# Patient Record
Sex: Female | Born: 1950 | Race: Black or African American | Hispanic: No | Smoking: Never smoker
Health system: Southern US, Community
[De-identification: ages and names within clinical notes are randomized; demographics above are authoritative.]

## PROBLEM LIST (undated history)

## (undated) DIAGNOSIS — R339 Retention of urine, unspecified: Secondary | ICD-10-CM

## (undated) DIAGNOSIS — R519 Headache, unspecified: Secondary | ICD-10-CM

## (undated) DIAGNOSIS — R3129 Other microscopic hematuria: Secondary | ICD-10-CM

## (undated) DIAGNOSIS — F329 Major depressive disorder, single episode, unspecified: Secondary | ICD-10-CM

## (undated) DIAGNOSIS — F32A Depression, unspecified: Secondary | ICD-10-CM

## (undated) DIAGNOSIS — K449 Diaphragmatic hernia without obstruction or gangrene: Secondary | ICD-10-CM

## (undated) DIAGNOSIS — A6000 Herpesviral infection of urogenital system, unspecified: Secondary | ICD-10-CM

## (undated) DIAGNOSIS — R42 Dizziness and giddiness: Secondary | ICD-10-CM

## (undated) DIAGNOSIS — N362 Urethral caruncle: Secondary | ICD-10-CM

## (undated) DIAGNOSIS — N952 Postmenopausal atrophic vaginitis: Secondary | ICD-10-CM

## (undated) DIAGNOSIS — M26629 Arthralgia of temporomandibular joint, unspecified side: Secondary | ICD-10-CM

## (undated) DIAGNOSIS — R51 Headache: Secondary | ICD-10-CM

## (undated) DIAGNOSIS — H8109 Meniere's disease, unspecified ear: Secondary | ICD-10-CM

## (undated) DIAGNOSIS — M549 Dorsalgia, unspecified: Secondary | ICD-10-CM

## (undated) HISTORY — DX: Headache: R51

## (undated) HISTORY — DX: Arthralgia of temporomandibular joint, unspecified side: M26.629

## (undated) HISTORY — DX: Herpesviral infection of urogenital system, unspecified: A60.00

## (undated) HISTORY — DX: Meniere's disease, unspecified ear: H81.09

## (undated) HISTORY — DX: Dorsalgia, unspecified: M54.9

## (undated) HISTORY — DX: Urethral caruncle: N36.2

## (undated) HISTORY — DX: Other microscopic hematuria: R31.29

## (undated) HISTORY — DX: Diaphragmatic hernia without obstruction or gangrene: K44.9

## (undated) HISTORY — DX: Retention of urine, unspecified: R33.9

## (undated) HISTORY — DX: Postmenopausal atrophic vaginitis: N95.2

## (undated) HISTORY — DX: Headache, unspecified: R51.9

---

## 1981-07-18 HISTORY — PX: TUBAL LIGATION: SHX77

## 1998-07-18 HISTORY — PX: OTHER SURGICAL HISTORY: SHX169

## 1998-07-18 HISTORY — PX: EYE SURGERY: SHX253

## 2000-07-18 HISTORY — PX: SHOULDER ARTHROSCOPY: SHX128

## 2004-04-26 ENCOUNTER — Ambulatory Visit: Payer: Self-pay | Admitting: Unknown Physician Specialty

## 2005-05-20 ENCOUNTER — Ambulatory Visit: Payer: Self-pay | Admitting: Family Medicine

## 2009-05-19 ENCOUNTER — Emergency Department: Payer: Self-pay | Admitting: Emergency Medicine

## 2010-07-01 ENCOUNTER — Ambulatory Visit: Payer: Self-pay | Admitting: Otolaryngology

## 2011-06-01 ENCOUNTER — Emergency Department (INDEPENDENT_AMBULATORY_CARE_PROVIDER_SITE_OTHER): Payer: Worker's Compensation

## 2011-06-01 ENCOUNTER — Emergency Department (INDEPENDENT_AMBULATORY_CARE_PROVIDER_SITE_OTHER)
Admission: EM | Admit: 2011-06-01 | Discharge: 2011-06-01 | Disposition: A | Discharge status: Home / Self Care | Payer: Worker's Compensation | Source: Home / Self Care

## 2011-06-01 ENCOUNTER — Encounter: Payer: Self-pay | Admitting: *Deleted

## 2011-06-01 DIAGNOSIS — M79609 Pain in unspecified limb: Secondary | ICD-10-CM

## 2011-06-01 DIAGNOSIS — S8010XA Contusion of unspecified lower leg, initial encounter: Secondary | ICD-10-CM

## 2011-06-01 DIAGNOSIS — S8012XA Contusion of left lower leg, initial encounter: Secondary | ICD-10-CM

## 2011-06-01 HISTORY — DX: Depression, unspecified: F32.A

## 2011-06-01 HISTORY — DX: Dizziness and giddiness: R42

## 2011-06-01 HISTORY — DX: Major depressive disorder, single episode, unspecified: F32.9

## 2011-06-01 MED ORDER — HYDROCODONE-ACETAMINOPHEN 5-500 MG PO TABS: 1.0000 | ORAL_TABLET | Freq: Four times a day (QID) | ORAL | Status: AC | PRN

## 2011-06-01 MED ORDER — IBUPROFEN 800 MG PO TABS: 800.0000 mg | ORAL_TABLET | Freq: Three times a day (TID) | ORAL | Status: AC

## 2011-06-01 NOTE — ED Notes (Signed)
Pt is here with complaints of left lower leg/knee pain following a fall down the stairs Friday night.  Bruising noted.

## 2011-06-08 NOTE — ED Provider Notes (Signed)
History     CSN: [id number] Arrival date & time: 06/01/2011  6:52 PM   First MD Initiated Contact with Patient 06/01/11 1858      Chief Complaint  Patient presents with  . Knee Injury    (Consider location/radiation/quality/duration/timing/severity/associated sxs/prior treatment) Patient is a 60 y.o. female presenting with leg pain. The history is provided by the patient.  Leg Pain  The incident occurred more than 2 days ago (fell down the stairs 4 days ago hit her left leg below the knee. persistent pain has a bruise.). The injury mechanism was a fall. The pain is present in the left leg and left knee. The quality of the pain is described as aching. The pain is at a severity of 4/10. The pain has been constant since onset. Pertinent negatives include no numbness, no inability to bear weight, no loss of motion, no muscle weakness, no loss of sensation and no tingling. The symptoms are aggravated by palpation. She has tried acetaminophen for the symptoms. The treatment provided mild relief.    Past Medical History  Diagnosis Date  . Vertigo   . Depression     History reviewed. No pertinent past surgical history.  Family History  Problem Relation Age of Onset  . Diabetes Mother   . Cancer Father   . Cancer Sister   . Diabetes Brother     History  Substance Use Topics  . Smoking status: Never Smoker   . Smokeless tobacco: Not on file  . Alcohol Use: No    OB History    Grav Para Term Preterm Abortions TAB SAB Ect Mult Living                  Review of Systems  Constitutional: Negative.   HENT:       No head injury  Respiratory: Negative for shortness of breath.        No chest injury  Gastrointestinal: Negative for abdominal pain.  Musculoskeletal: Negative for back pain.       No hip pain  Skin:       Bruise below the left knee no skin lacerations or abrassions  Neurological: Negative for tingling, numbness and headaches.    Allergies  Strawberry  Home  Medications   Current Outpatient Rx  Name Route Sig Dispense Refill  . HYDROCODONE-ACETAMINOPHEN 5-500 MG PO TABS Oral Take 1-2 tablets by mouth every 6 (six) hours as needed for pain. 15 tablet 0  . IBUPROFEN 800 MG PO TABS Oral Take 1 tablet (800 mg total) by mouth 3 (three) times daily. 21 tablet 0    Take with food    BP 111/67  Pulse 74  Temp(Src) 98.4 F (36.9 C) (Oral)  Resp 16  SpO2 100%  Physical Exam  Constitutional: She is oriented to person, place, and time. She appears well-developed and well-nourished. No distress.  HENT:  Head: Normocephalic and atraumatic.  Neck: Normal range of motion. Neck supple.  Cardiovascular: Normal rate, regular rhythm, normal heart sounds and intact distal pulses.   Pulmonary/Chest: Effort normal and breath sounds normal. No respiratory distress.  Abdominal: Soft. There is no tenderness.  Musculoskeletal:       Left knee: No effusion or swelling. FROM. No laxity. There is diffuse tenderness to palpation with mild swelling and resolving bruising over tibial bone about 4 cm below the knee. Pt can bear weight and walk with no difficulty.  Neurological: She is alert and oriented to person, place, and time. She  displays normal reflexes.    ED Course  Procedures (including critical care time)  Labs Reviewed - No data to display No results found.   1. Contusion of left leg       MDM          Sharin Grave, MD 06/12/11 1055

## 2011-06-24 ENCOUNTER — Emergency Department (INDEPENDENT_AMBULATORY_CARE_PROVIDER_SITE_OTHER)
Admission: EM | Admit: 2011-06-24 | Discharge: 2011-06-24 | Disposition: A | Discharge status: Home / Self Care | Payer: Worker's Compensation | Source: Home / Self Care | Attending: Family Medicine | Admitting: Family Medicine

## 2011-06-24 ENCOUNTER — Encounter (HOSPITAL_COMMUNITY): Payer: Self-pay | Admitting: Emergency Medicine

## 2011-06-24 DIAGNOSIS — S8010XA Contusion of unspecified lower leg, initial encounter: Secondary | ICD-10-CM

## 2011-06-24 DIAGNOSIS — M79609 Pain in unspecified limb: Secondary | ICD-10-CM

## 2011-06-24 DIAGNOSIS — M79606 Pain in leg, unspecified: Secondary | ICD-10-CM

## 2011-06-24 MED ORDER — MEDICAL COMPRESSION THIGH HIGH MISC: 1.0000 | Freq: Every day | Status: DC

## 2011-06-24 NOTE — ED Provider Notes (Signed)
60 year old female presenting for followup of her workers comp injury. 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  This resulted in pain and swelling. She was seen in the urgent care Center and x-rays were obtained showing no fracture.  In the interval she has done well however she continues to experience pain and a soft spot on the area where her shin collided with a stair.  Additionally she has noted swelling of the posterior left calf distally associated with some pain at the end of the day.  She notes some pain with walking at the end of the day and her left posterior calf. She denies any swelling in the mornings or cough or shortness of breath and feels well otherwise.  PMH reviewed.  ROS as above otherwise neg Medications reviewed. No current facility-administered medications for this encounter.   Current Outpatient Prescriptions  Medication Sig Dispense Refill  . meclizine (ANTIVERT) 12.5 MG tablet Take 12.5 mg by mouth 3 (three) times daily as needed.        Clinical research associate Bandages & Supports (MEDICAL COMPRESSION THIGH HIGH) MISC 1 Package by Does not apply route daily.  2 each  0    Exam:  BP 103/38  Pulse 75  Temp(Src) 98.1 F (36.7 C) (Oral)  Resp 18 Gen: Well NAD MSK: Soft area and some skin discoloration on the proximal anterior left shin.  Mildly tender to palpation along the distal posterior left calf.  Normal range of motion of the ankle and foot. Strength preserved and Achilles tendon soleus tendon posterior tibialis and perineal tendon.  The patient has normal gait and is able to walk on her toes.  Calf circumference measures 14 cm on left calf and 13.5 cm on right.  A/P 60 year old female one month status post periosteal injury to left tibia.  She is left with what appears to be dependent swelling in the left distal calf.  She has no indication of tendinous injury in her left  extremity. I feel the swelling is very much likely to be due to dependent swelling from the tibial injury.  However DVT is a very remote possibility.  Plan: Obtain d-dimer tonight however I feel the likelihood of DVT is very unlikely.  Additionally recommend compression stockings for her work.  Also have referred patient to physical therapy to learn some Strengthening exercises. Will followup in urgent care clinic in one month or less if symptoms are persistent.    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  Clementeen Graham 06/24/11 2008

## 2011-06-24 NOTE — ED Notes (Signed)
Pt having continued pain to Left leg over a month. The soreness is increasing.

## 2011-07-29 NOTE — ED Notes (Signed)
Call from [facility] adjuster, Harriett Sine, in an attempt to establish which type of stocking should be applied from pt 12-7 visit to [facility] for follow up of 11-14 visit. As we do not provide on going WC follow up , and patient was advised to see Dr Dorene Grebe on 11-14 visit  And dr Terance Hart on 12-07 visit, the case manager or adjuster would probably be the best one to  determine with whom she should follow up, and decide if the stocking is still needed for her care

## 2011-12-06 ENCOUNTER — Ambulatory Visit: Payer: Self-pay | Admitting: Family Medicine

## 2012-04-10 ENCOUNTER — Ambulatory Visit: Payer: Self-pay | Admitting: Urology

## 2012-04-16 ENCOUNTER — Ambulatory Visit: Payer: Self-pay | Admitting: Urology

## 2012-06-20 ENCOUNTER — Emergency Department (HOSPITAL_COMMUNITY)
Admission: EM | Admit: 2012-06-20 | Discharge: 2012-06-20 | Disposition: A | Discharge status: Home / Self Care | Payer: BC Managed Care – PPO | Attending: Emergency Medicine | Admitting: Emergency Medicine

## 2012-06-20 ENCOUNTER — Encounter (HOSPITAL_COMMUNITY): Payer: Self-pay

## 2012-06-20 ENCOUNTER — Emergency Department (HOSPITAL_COMMUNITY): Payer: BC Managed Care – PPO

## 2012-06-20 ENCOUNTER — Ambulatory Visit (INDEPENDENT_AMBULATORY_CARE_PROVIDER_SITE_OTHER): Payer: BC Managed Care – PPO | Admitting: Emergency Medicine

## 2012-06-20 VITALS — BP 149/87 | HR 92 | Temp 98.6°F | Resp 18

## 2012-06-20 DIAGNOSIS — Z79899 Other long term (current) drug therapy: Secondary | ICD-10-CM | POA: Insufficient documentation

## 2012-06-20 DIAGNOSIS — R079 Chest pain, unspecified: Secondary | ICD-10-CM

## 2012-06-20 DIAGNOSIS — Z8659 Personal history of other mental and behavioral disorders: Secondary | ICD-10-CM | POA: Insufficient documentation

## 2012-06-20 DIAGNOSIS — R071 Chest pain on breathing: Secondary | ICD-10-CM | POA: Insufficient documentation

## 2012-06-20 DIAGNOSIS — K859 Acute pancreatitis without necrosis or infection, unspecified: Secondary | ICD-10-CM | POA: Insufficient documentation

## 2012-06-20 DIAGNOSIS — Z7982 Long term (current) use of aspirin: Secondary | ICD-10-CM | POA: Insufficient documentation

## 2012-06-20 LAB — COMPREHENSIVE METABOLIC PANEL
ALT: 16 U/L (ref 0–35)
Albumin: 3.4 g/dL — ABNORMAL LOW (ref 3.5–5.2)
BUN: 9 mg/dL (ref 6–23)
GFR calc Af Amer: >90 mL/min (ref >90)
GFR calc non Af Amer: >90 mL/min (ref >90)
Glucose, Bld: 82 mg/dL (ref 70–99)

## 2012-06-20 LAB — CBC WITH DIFFERENTIAL/PLATELET
Basophils Absolute: 0 10*3/uL (ref 0.0–0.1)
Basophils Relative: 0 % (ref 0–1)
Eosinophils Absolute: 0.4 10*3/uL (ref 0.0–0.7)
Lymphs Abs: 3.1 10*3/uL (ref 0.7–4.0)
MCH: 28.4 pg (ref 26.0–34.0)
Monocytes Relative: 7 % (ref 3–12)
Neutro Abs: 4 10*3/uL (ref 1.7–7.7)
Neutrophils Relative %: 49 % (ref 43–77)
RBC: 4.19 MIL/uL (ref 3.87–5.11)

## 2012-06-20 LAB — LIPASE, BLOOD: Lipase: 112 U/L — ABNORMAL HIGH (ref 11–59)

## 2012-06-20 MED ORDER — ASPIRIN 81 MG PO CHEW: 324.0000 mg | CHEWABLE_TABLET | Freq: Once | ORAL | Status: DC

## 2012-06-20 MED ORDER — OXYCODONE-ACETAMINOPHEN 5-325 MG PO TABS: 1.0000 | ORAL_TABLET | Freq: Four times a day (QID) | ORAL | Status: DC | PRN

## 2012-06-20 MED ORDER — SODIUM CHLORIDE 0.9 % IV SOLN: INTRAVENOUS | Status: DC

## 2012-06-20 MED ORDER — ONDANSETRON HCL 4 MG PO TABS: 4.0000 mg | ORAL_TABLET | Freq: Four times a day (QID) | ORAL | Status: DC

## 2012-06-20 NOTE — ED Notes (Signed)
Pt with chest pain starting at 0100, started as a heartburn feeling to more of a chest pressure in the left side of chest, no nausea, diaphoresis, NTG times one, pain worse with movement and deep inspiration

## 2012-06-20 NOTE — ED Provider Notes (Signed)
Medical screening examination/treatment/procedure(s) were conducted as a shared visit with non-physician practitioner(s) and myself.  I personally evaluated the patient during the encounter  Jahira Swiss T Ruari Duggan, MD 06/20/12 1544 

## 2012-06-20 NOTE — Progress Notes (Signed)
  Subjective:    Patient ID: Lori Maddox, female    DOB: October 11, 1950, 61 y.o.   MRN: [id number]  HPI patient was fine yesterday. 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 This morning she awakened with a sharp left precordial pain which is nonradiating. This is associated with shortness of breath. She has no nausea or vomiting she has no radiation of the pain. She has no history of heart disease. She does not smoke has no history of diabetes high cholesterol high blood pressure and no family history of heart disease. She has not had pain like this in the past. She stopped at a stop and took 325 mg of aspirin but since her pain has persisted through the morning she presented for evaluation to    Review of Systems     Objective:   Physical Exam patient is tearful but in no acute distress. Her neck is supple. Her chest is clear to auscultation and percussion. Her cardiac exam reveals a regular rate without murmurs. There is exquisite tenderness over the chest wall over the fourth to sixth ribs at the costal sternal junction.  EKG normal sinus rhythm without acute change      Assessment & Plan:  Patient presents with physical exam and history most consistent with chest wall pain. We'll go ahead and put on a monitor start IV placed on O2 sent to the hospital for her enzymes evaluation.

## 2012-06-20 NOTE — ED Provider Notes (Signed)
History     CSN: [id number]  Arrival date & time 06/20/12  1224   First MD Initiated Contact with Patient 06/20/12 1240      Chief Complaint  Patient presents with  . Chest Pain    (Consider location/radiation/quality/duration/timing/severity/associated sxs/prior treatment) HPI  Pt to the ER with complaints of chest pain that started at 0100 this morning. She has had multiple episodes each one lasting on a few minutes. The pains are left sided and are sharp pains that take her breath away. When the pains come it makes her anxious and she develops shortness of breath. She denies having any hx of heart problems and also denies a family hx. "my family members live to be 100". She exercises everyday and does not smoke. She denies a hx of GERD or acid reflux. She has been lifting mildly heavy materials but does not feel this is a pulled muscle. nad vss. Currently pain free. Took one aspirin prior to arrival.  Past Medical History  Diagnosis Date  . Vertigo   . Depression     History reviewed. No pertinent past surgical history.  Family History  Problem Relation Age of Onset  . Diabetes Mother   . Cancer Father   . Cancer Sister   . Diabetes Brother     History  Substance Use Topics  . Smoking status: Never Smoker   . Smokeless tobacco: Not on file  . Alcohol Use: No    OB History    Grav Para Term Preterm Abortions TAB SAB Ect Mult Living                  Review of Systems  Review of Systems  Gen: no weight loss, fevers, chills, night sweats  Neck: no neck pain  Lungs:No wheezing, coughing or hemoptysis CV: + chest pain, no palpitations, dependent edema or orthopnea  Abd: no abdominal pain, nausea, vomiting  GU: no dysuria or gross hematuria  MSK:  No abnormalities  Neuro: no headache, no focal neurologic deficits  Skin: no abnormalities Psyche: negative.   Allergies  Shellfish allergy and Strawberry  Home Medications   Current Outpatient Rx  Name   Route  Sig  Dispense  Refill  . ASPIRIN 325 MG PO TABS   Oral   Take 325 mg by mouth daily.         Marland Kitchen NAPROXEN SODIUM 220 MG PO TABS   Oral   Take 440 mg by mouth daily as needed. For pain         . POLYETHYL GLYCOL-PROPYL GLYCOL 0.4-0.3 % OP SOLN   Right Eye   Place 2 drops into the right eye daily as needed. For dryness         . SULFAMETHOXAZOLE-TMP DS 800-160 MG PO TABS   Oral   Take 1 tablet by mouth 2 (two) times daily.         Marland Kitchen VITAMIN E PO   Oral   Take 1 capsule by mouth daily.         Marland Kitchen ONDANSETRON HCL 4 MG PO TABS   Oral   Take 1 tablet (4 mg total) by mouth every 6 (six) hours.   12 tablet   0   . OXYCODONE-ACETAMINOPHEN 5-325 MG PO TABS   Oral   Take 1 tablet by mouth every 6 (six) hours as needed for pain.   15 tablet   0     BP 119/107  Pulse 70  Temp 98.3 F (  36.8 C) (Oral)  Resp 18  SpO2 100%  Physical Exam  Nursing note and vitals reviewed. Constitutional: She appears well-developed and well-nourished. No distress.  HENT:  Head: Normocephalic and atraumatic.  Eyes: Pupils are equal, round, and reactive to light.  Neck: Normal range of motion. Neck supple.  Cardiovascular: Normal rate and regular rhythm.   Pulmonary/Chest: Effort normal. Chest wall is not dull to percussion. She exhibits tenderness (mild tenderness to palpation of chest wall). She exhibits no laceration.    Abdominal: Soft.  Neurological: She is alert.  Skin: Skin is warm and dry.    ED Course  Procedures (including critical care time)  Labs Reviewed  CBC WITH DIFFERENTIAL - Abnormal; Notable for the following:    Hemoglobin 11.9 (*)     All other components within normal limits  COMPREHENSIVE METABOLIC PANEL - Abnormal; Notable for the following:    Albumin 3.4 (*)     All other components within normal limits  LIPASE, BLOOD - Abnormal; Notable for the following:    Lipase 112 (*)     All other components within normal limits  POCT I-STAT TROPONIN I     Dg Chest 2 View  06/20/2012  *RADIOLOGY REPORT*  Clinical Data: Chest pain.  CHEST - 2 VIEW  Comparison: None.  Findings: The heart, mediastinal and hilar contours are normal. The lungs are clear and fully expanded.  There are no effusions or pneumothoraces.  There are no acute bony changes.  IMPRESSION: No active disease.   Original Report Authenticated By: Sander Radon, M.D.      1. Pancreatitis       MDM  Patients lipase has come back elevated. Troponin is negative. Pt has remained pain free in the ED. Unlikely cardiac etiology and with lipase positive at 112 I will treat as a pancreatitis. Dr. Freida Busman has seen patient and agrees with my findings. Will place her on a liquid diet and give zofran and percocet.  Pt has been advised of the symptoms that warrant their return to the ED. Patient has voiced understanding and has agreed to follow-up with the PCP or specialist.         Dorthula Matas, PA 06/20/12 [phone]

## 2012-06-25 ENCOUNTER — Ambulatory Visit: Payer: Self-pay | Admitting: Family Medicine

## 2012-10-10 ENCOUNTER — Ambulatory Visit: Payer: Self-pay | Admitting: Unknown Physician Specialty

## 2012-10-15 ENCOUNTER — Ambulatory Visit: Payer: Self-pay | Admitting: Unknown Physician Specialty

## 2012-10-16 ENCOUNTER — Ambulatory Visit: Payer: Self-pay | Admitting: Unknown Physician Specialty

## 2012-10-17 LAB — PATHOLOGY REPORT

## 2012-11-16 ENCOUNTER — Ambulatory Visit: Payer: Self-pay | Admitting: Unknown Physician Specialty

## 2012-12-04 ENCOUNTER — Encounter: Payer: Self-pay | Admitting: Emergency Medicine

## 2013-04-04 ENCOUNTER — Encounter: Payer: Self-pay | Admitting: Family Medicine

## 2013-04-17 ENCOUNTER — Encounter: Payer: Self-pay | Admitting: Family Medicine

## 2013-05-18 ENCOUNTER — Encounter: Payer: Self-pay | Admitting: Family Medicine

## 2013-10-10 ENCOUNTER — Encounter: Payer: Self-pay | Admitting: Family Medicine

## 2013-10-16 ENCOUNTER — Encounter: Payer: Self-pay | Admitting: Family Medicine

## 2013-11-15 ENCOUNTER — Encounter: Payer: Self-pay | Admitting: Family Medicine

## 2013-12-16 ENCOUNTER — Encounter: Payer: Self-pay | Admitting: Family Medicine

## 2014-04-27 ENCOUNTER — Emergency Department: Payer: Self-pay | Admitting: Emergency Medicine

## 2014-07-08 ENCOUNTER — Emergency Department: Payer: Self-pay | Admitting: Emergency Medicine

## 2014-07-08 LAB — COMPREHENSIVE METABOLIC PANEL
Albumin: 3.5 g/dL (ref 3.4–5.0)
Alkaline Phosphatase: 98 U/L
Anion Gap: 7 (ref 7–16)
BUN: 15 mg/dL (ref 7–18)
Bilirubin,Total: 0.3 mg/dL (ref 0.2–1.0)
Calcium, Total: 8.5 mg/dL (ref 8.5–10.1)
Chloride: 108 mmol/L — ABNORMAL HIGH (ref 98–107)
Co2: 24 mmol/L (ref 21–32)
Creatinine: 0.86 mg/dL (ref 0.60–1.30)
EGFR (African American): >60
Glucose: 106 mg/dL — ABNORMAL HIGH (ref 65–99)
Osmolality: 279 (ref 275–301)
Potassium: 4.4 mmol/L (ref 3.5–5.1)
SGOT(AST): 34 U/L (ref 15–37)
SGPT (ALT): 27 U/L
Sodium: 139 mmol/L (ref 136–145)
TOTAL PROTEIN: 7.6 g/dL (ref 6.4–8.2)

## 2014-07-08 LAB — CBC
HCT: 39.7 % (ref 35.0–47.0)
HGB: 13 g/dL (ref 12.0–16.0)
MCH: 28.5 pg (ref 26.0–34.0)
MCHC: 32.8 g/dL (ref 32.0–36.0)
MCV: 87 fL (ref 80–100)
Platelet: 347 10*3/uL (ref 150–440)
RBC: 4.56 10*6/uL (ref 3.80–5.20)
RDW: 13.1 % (ref 11.5–14.5)
WBC: 8.4 10*3/uL (ref 3.6–11.0)

## 2014-07-08 LAB — LIPASE, BLOOD: LIPASE: 236 U/L (ref 73–393)

## 2014-07-08 LAB — URINALYSIS, COMPLETE
Bilirubin,UR: NEGATIVE
GLUCOSE, UR: NEGATIVE mg/dL (ref 0–75)
Ketone: NEGATIVE
Nitrite: NEGATIVE
Ph: 6 (ref 4.5–8.0)
Protein: NEGATIVE
RBC,UR: <1 /HPF (ref 0–5)
Specific Gravity: 1.008 (ref 1.003–1.030)

## 2014-07-08 LAB — TROPONIN I: Troponin-I: <0.02 ng/mL

## 2014-07-10 LAB — URINE CULTURE

## 2014-07-14 DIAGNOSIS — M549 Dorsalgia, unspecified: Secondary | ICD-10-CM | POA: Insufficient documentation

## 2014-07-14 DIAGNOSIS — Z87448 Personal history of other diseases of urinary system: Secondary | ICD-10-CM | POA: Insufficient documentation

## 2014-07-14 DIAGNOSIS — R933 Abnormal findings on diagnostic imaging of other parts of digestive tract: Secondary | ICD-10-CM | POA: Insufficient documentation

## 2014-07-14 DIAGNOSIS — R1032 Left lower quadrant pain: Secondary | ICD-10-CM | POA: Insufficient documentation

## 2014-07-14 DIAGNOSIS — R1012 Left upper quadrant pain: Secondary | ICD-10-CM | POA: Insufficient documentation

## 2014-07-25 ENCOUNTER — Ambulatory Visit: Payer: Self-pay | Admitting: Unknown Physician Specialty

## 2014-07-25 LAB — CLOSTRIDIUM DIFFICILE(ARMC)

## 2014-08-05 DIAGNOSIS — R8290 Unspecified abnormal findings in urine: Secondary | ICD-10-CM | POA: Insufficient documentation

## 2014-11-04 NOTE — Op Note (Signed)
PATIENT NAME:  Lori Maddox, Lori Maddox MR#:  [id number] DATE OF BIRTH:  07/02/1951  DATE OF PROCEDURE:  04/16/2012  PREOPERATIVE DIAGNOSES:  1. Interstitial cystitis.  2. Pseudomembranous trigonitis. 3. Recurrent urinary tract infections.  4. Urethral polyps.  POSTOPERATIVE DIAGNOSES:  1. Interstitial cystitis.  2. Pseudomembranous trigonitis. 3. Recurrent urinary tract infections.  4. Urethral polyps.  PROCEDURES: 1. Cystoscopy.  2. Hydrodilation.  3. Fulguration.   SURGEON: Rica KoyanagiJohn S. Marlynn Hinckley, MD  ANESTHESIA: General.   INDICATIONS: This patient had persistent pain in the bladder with recurrent UTIs. Evaluations revealed pseudomembranous trigonitis and urethral polyps in addition to chronic irritation of the bladder. Interstitial cystitis was suspected and this procedure scheduled.   DESCRIPTION OF PROCEDURE: In the dorsal lithotomy position, under general anesthesia, the lower abdomen and genital area was prepped and draped for endoscopic work. The #21 cystoscope was introduced and the previously noted lesions reconfirmed. No stones, ulcers, or tumors were present within the bladder. Hydrodilation was performed producing many glomerulations. The bladder accepted 450, 550, and 650 mL, respectively. Following hydrodilation the ball electrode was introduced and the trigone area carefully cauterized. The     polyps of the proximal urethra were likewise cauterized. The urethra was dilated to 2628 [place]. A #16 French Foley catheter was placed in the bladder and left indwelling. The patient tolerated the procedure well and returned to the recovery area in satisfactory condition. ____________________________ Rica KoyanagiJohn S. Angelie Kram, MD jsh:slb D: 04/16/2012 14:07:25 ET T: 04/16/2012 14:41:52 ET JOB#: [id number]  cc: Rica KoyanagiJohn S. Merdith Boyd, MD, <Dictator> Rica KoyanagiJOHN S Armarion Greek MD ELECTRONICALLY SIGNED 04/19/2012 13:51

## 2015-01-06 ENCOUNTER — Encounter: Payer: Self-pay | Admitting: *Deleted

## 2015-01-08 ENCOUNTER — Encounter: Payer: Self-pay | Admitting: Urology

## 2015-01-08 ENCOUNTER — Ambulatory Visit (INDEPENDENT_AMBULATORY_CARE_PROVIDER_SITE_OTHER): Payer: BC Managed Care – PPO | Admitting: Urology

## 2015-01-08 VITALS — BP 102/66 | HR 71 | Ht 63.0 in | Wt 154.5 lb

## 2015-01-08 DIAGNOSIS — N362 Urethral caruncle: Secondary | ICD-10-CM

## 2015-01-08 DIAGNOSIS — R3129 Other microscopic hematuria: Secondary | ICD-10-CM

## 2015-01-08 DIAGNOSIS — R312 Other microscopic hematuria: Secondary | ICD-10-CM

## 2015-01-08 DIAGNOSIS — N952 Postmenopausal atrophic vaginitis: Secondary | ICD-10-CM | POA: Diagnosis not present

## 2015-01-08 LAB — MICROSCOPIC EXAMINATION: BACTERIA UA: NONE SEEN

## 2015-01-08 LAB — URINALYSIS, COMPLETE
Bilirubin, UA: NEGATIVE
Glucose, UA: NEGATIVE
Ketones, UA: NEGATIVE
Leukocytes, UA: NEGATIVE
NITRITE UA: NEGATIVE
Protein, UA: NEGATIVE
Specific Gravity, UA: 1.02 (ref 1.005–1.030)
UUROB: 0.2 mg/dL (ref 0.2–1.0)
pH, UA: 5.5 (ref 5.0–7.5)

## 2015-01-08 MED ORDER — ESTRADIOL 0.1 MG/GM VA CREA: 1.0000 | TOPICAL_CREAM | Freq: Every day | VAGINAL | Status: DC

## 2015-01-08 NOTE — Progress Notes (Signed)
In and Out Catheterization  Patient is present today for a I & O catheterization due to hematuria. Patient was cleaned and prepped in a sterile fashion with betadine and a 16FR cath was inserted with jelly on end of the cath, no complications were noted , 74ml of urine return was noted, urine was clear in color. A clean urine sample was collected for clean UA. Bladder was drained  And catheter was removed with out difficulty.    Preformed by: Dallas Schimke CMA

## 2015-01-08 NOTE — Progress Notes (Signed)
01/08/2015 10:40 AM   Lori Maddox 1950/08/18 [id number]  Referring provider: Dorothey Baseman, MD [address] [facility] [place], [place] [address]  Chief Complaint  Patient presents with  . Hematuria    microscopic 3 week follow up  . Vaginitis    atrophic    HPI: Lori Maddox is a 64 year old African-American female who presents today for recheck on her urinalysis to look for microscopic hematuria. She denies any gross hematuria. On her initial visit with [place] back in December, she was found to have atrophic vaginitis and urethral carbuncle. Microscopic hematuria felt to be coming from this. She was prescribed vaginal estrogen cream which she continues to use. The microscopic hematuria had abated. It did return after an episode of painful intercourse. Today, we'll obtain a catheter specimen.   Story: Patient has one incident of microscopic hematuria on 07/14/2014 with 4-10 RBC's/hpf. She had 1RBCs/hpf on 08/17/2014. She has 2RBCs/hpf on today's exam. She does not meet the AUA Guidelines (two microscopic analysis with three or greater RBCs/hpf) for a microscopic hematuria work up at this time. She also denies any gross hematuria. Impression: She did have 3-10RBCs/hpf on 12/16/2014, but she did have painful intercourse and there was an area of bleeding in the introitus.    PMH: Past Medical History  Diagnosis Date  . Vertigo   . Depression   . Hiatal hernia   . Microscopic hematuria   . Sinus headache   . Meniere disease   . TMJ syndrome   . Genital herpes   . Atrophic vaginitis   . Urethral caruncle   . Incomplete bladder emptying   . Back pain     Surgical History: Past Surgical History  Procedure Laterality Date  . Shoulder arthroscopy Left 2002  . Liposuction  2000  . Eye surgery  2000  . Tubal ligation  1983  . Cesarean section  1979/1982    2    Home Medications:    Medication List       This list is accurate  as of: 01/08/15 10:40 AM.  Always use your most recent med list.               aspirin 325 MG tablet  Take 325 mg by mouth daily.     benzonatate 100 MG capsule  Commonly known as:  TESSALON  Take by mouth.     naproxen sodium 220 MG tablet  Commonly known as:  ANAPROX  Take 440 mg by mouth daily as needed. For pain     ondansetron 4 MG tablet  Commonly known as:  ZOFRAN  Take 1 tablet (4 mg total) by mouth every 6 (six) hours.     oxyCODONE-acetaminophen 5-325 MG per tablet  Commonly known as:  PERCOCET/ROXICET  Take 1 tablet by mouth every 6 (six) hours as needed for pain.     PROAIR HFA 108 (90 BASE) MCG/ACT inhaler  Generic drug:  albuterol  Inhale into the lungs.     sulfamethoxazole-trimethoprim 800-160 MG per tablet  Commonly known as:  BACTRIM DS  Take 1 tablet by mouth 2 (two) times daily.     SYSTANE ULTRA 0.4-0.3 % Soln  Generic drug:  Polyethyl Glycol-Propyl Glycol  Place 2 drops into the right eye daily as needed. For dryness     VITAMIN E PO  Take 1 capsule by mouth daily.        Allergies:  Allergies  Allergen Reactions  .  Iodinated Diagnostic Agents Anaphylaxis  . Shellfish Allergy Anaphylaxis  . Strawberry Hives    Family History: Family History  Problem Relation Age of Onset  . Diabetes Mother   . Cancer Father     Pancreatic  . Cancer Sister   . Diabetes Brother   . Hematuria Mother   . Uterine cancer Mother   . Bladder Cancer Mother   . Kidney disease Neg Hx     Social History:  reports that she has never smoked. She does not have any smokeless tobacco history on file. She reports that she drinks alcohol. She reports that she does not use illicit drugs.  ROS: Urological Symptom Review  Patient is experiencing the following symptoms: Blood in urine   Review of Systems  Gastrointestinal (upper)  : Negative for upper GI symptoms  Gastrointestinal (lower) : Constipation  Constitutional : Negative for  symptoms  Skin: Negative for skin symptoms  Eyes: Negative for eye symptoms  Ear/Nose/Throat : Negative for Ear/Nose/Throat symptoms  Hematologic/Lymphatic: Negative for Hematologic/Lymphatic symptoms  Cardiovascular : Negative for cardiovascular symptoms  Respiratory : Negative for respiratory symptoms  Endocrine: Negative for endocrine symptoms  Musculoskeletal: Negative for musculoskeletal symptoms  Neurological: Negative for neurological symptoms  Psychologic: Negative for psychiatric symptoms   Physical Exam: BP 102/66 mmHg  Pulse 71  Ht  (1.6 m)  Wt 154 lb 8 oz (70.081 kg)  BMI 27.38 kg/m2   Laboratory Data: Results for orders placed or performed in visit on 01/08/15  Microscopic Examination  Result Value Ref Range   WBC, UA 0-5 0 -  5 /hpf   RBC, UA 0-2 0 -  2 /hpf   Epithelial Cells (non renal) 0-10 0 - 10 /hpf   Bacteria, UA None seen None seen/Few  Urinalysis, Complete  Result Value Ref Range   Specific Gravity, UA 1.020 1.005 - 1.030   pH, UA 5.5 5.0 - 7.5   Color, UA Yellow Yellow   Appearance Ur Clear Clear   Leukocytes, UA Negative Negative   Protein, UA Negative Negative/Trace   Glucose, UA Negative Negative   Ketones, UA Negative Negative   RBC, UA 1+ (A) Negative   Bilirubin, UA Negative Negative   Urobilinogen, Ur 0.2 0.2 - 1.0 mg/dL   Nitrite, UA Negative Negative   Microscopic Examination See below:    Lab Results  Component Value Date   WBC 8.4 07/08/2014   HGB 13.0 07/08/2014   HCT 39.7 07/08/2014   MCV 87 07/08/2014   PLT 347 07/08/2014    Lab Results  Component Value Date   CREATININE 0.86 07/08/2014    No results found for: PSA  No results found for: TESTOSTERONE  No results found for: HGBA1C  Urinalysis No results found for: COLORURINE, APPEARANCEUR, LABSPEC, PHURINE, GLUCOSEU, HGBUR, BILIRUBINUR, KETONESUR, PROTEINUR, UROBILINOGEN, NITRITE, LEUKOCYTESUR  Pertinent Imaging:   Assessment & Plan:     1. Microscopic hematuria:  Patient did not have microscopic hematuria on today's cath specimen. We will continue to monitor with yearly urinalyses. She will report to the office if she should develop any gross hematuria.  2. Atrophic vaginitis:   Continue the estrogen vaginal cream three nights weekly and RTC in one year.  3. Urethral caruncle:   Caruncle reducing in size. She will now apply the estrogen vaginal cream Monday nights, Wednesday nights and Friday nights. She will RTC in one year.  - Urinalysis, Complete   No Follow-up on file.  Michiel Cowboy, PA-C  [facility] [address], [place], [place] [address] [phone]

## 2015-01-12 DIAGNOSIS — N362 Urethral caruncle: Secondary | ICD-10-CM | POA: Insufficient documentation

## 2015-01-12 DIAGNOSIS — R3129 Other microscopic hematuria: Secondary | ICD-10-CM | POA: Insufficient documentation

## 2015-01-12 DIAGNOSIS — N952 Postmenopausal atrophic vaginitis: Secondary | ICD-10-CM | POA: Insufficient documentation

## 2015-05-20 ENCOUNTER — Emergency Department (INDEPENDENT_AMBULATORY_CARE_PROVIDER_SITE_OTHER)
Admission: EM | Admit: 2015-05-20 | Discharge: 2015-05-20 | Disposition: A | Discharge status: Home / Self Care | Payer: BC Managed Care – PPO | Source: Home / Self Care | Attending: Family Medicine | Admitting: Family Medicine

## 2015-05-20 ENCOUNTER — Encounter (HOSPITAL_COMMUNITY): Payer: Self-pay | Admitting: *Deleted

## 2015-05-20 DIAGNOSIS — J069 Acute upper respiratory infection, unspecified: Secondary | ICD-10-CM | POA: Diagnosis not present

## 2015-05-20 MED ORDER — DEXTROMETHORPHAN POLISTIREX ER 30 MG/5ML PO SUER: 60.0000 mg | Freq: Two times a day (BID) | ORAL | Status: DC

## 2015-05-20 MED ORDER — IPRATROPIUM BROMIDE 0.06 % NA SOLN: 2.0000 | Freq: Four times a day (QID) | NASAL | Status: DC

## 2015-05-20 MED ORDER — AZITHROMYCIN 250 MG PO TABS: ORAL_TABLET | ORAL | Status: DC

## 2015-05-20 NOTE — Discharge Instructions (Signed)
Drink plenty of fluids as discussed, use medicine as prescribed, and mucinex-d  for cough. Return or see your doctor if further problems

## 2015-05-20 NOTE — ED Provider Notes (Signed)
CSN: [id number]     Arrival date & time 05/20/15  1936 History   First MD Initiated Contact with Patient 05/20/15 2009     Chief Complaint  Patient presents with  . Cough   (Consider location/radiation/quality/duration/timing/severity/associated sxs/prior Treatment) Patient is a 64 y.o. female presenting with cough. The history is provided by the patient.  Cough Cough characteristics:  Non-productive, dry and hacking Severity:  Mild Onset quality:  Gradual Duration:  6 hours Progression:  Unchanged Chronicity:  New (around sick children and for Halloween.) Smoker: no   Context: sick contacts and upper respiratory infection   Context: not weather changes and not with activity   Relieved by:  None tried Worsened by:  Nothing tried Ineffective treatments:  None tried Associated symptoms: rhinorrhea and sinus congestion   Associated symptoms: no fever, no shortness of breath, no sore throat and no wheezing     Past Medical History  Diagnosis Date  . Vertigo   . Depression   . Hiatal hernia   . Microscopic hematuria   . Sinus headache   . Meniere disease   . TMJ syndrome   . Genital herpes   . Atrophic vaginitis   . Urethral caruncle   . Incomplete bladder emptying   . Back pain    Past Surgical History  Procedure Laterality Date  . Shoulder arthroscopy Left 2002  . Liposuction  2000  . Eye surgery  2000  . Tubal ligation  1983  . Cesarean section  1979/1982    2   Family History  Problem Relation Age of Onset  . Diabetes Mother   . Cancer Father     Pancreatic  . Cancer Sister   . Diabetes Brother   . Hematuria Mother   . Uterine cancer Mother   . Bladder Cancer Mother   . Kidney disease Neg Hx    Social History  Substance Use Topics  . Smoking status: Never Smoker   . Smokeless tobacco: None  . Alcohol Use: 0.0 oz/week    0 Standard drinks or equivalent per week     Comment: occasional   OB History    No data available     Review of Systems   Constitutional: Negative.  Negative for fever.  HENT: Positive for congestion, postnasal drip and rhinorrhea. Negative for sore throat.   Respiratory: Positive for cough. Negative for shortness of breath and wheezing.   Cardiovascular: Negative.   All other systems reviewed and are negative.   Allergies  Iodinated diagnostic agents; Shellfish allergy; and Strawberry extract  Home Medications   Prior to Admission medications   Medication Sig Start Date End Date Taking? Authorizing Provider  albuterol (PROAIR HFA) 108 (90 BASE) MCG/ACT inhaler Inhale into the lungs. 11/17/14 11/17/15  Historical Provider, MD  aspirin 325 MG tablet Take 325 mg by mouth daily.    Historical Provider, MD  azithromycin (ZITHROMAX Z-PAK) 250 MG tablet Take as directed on pack 05/20/15   Linna Hoff, MD  benzonatate (TESSALON) 100 MG capsule Take by mouth.    Historical Provider, MD  dextromethorphan (DELSYM) 30 MG/5ML liquid Take 10 mLs (60 mg total) by mouth 2 (two) times daily. 05/20/15   Linna Hoff, MD  estradiol (ESTRACE) 0.1 MG/GM vaginal cream Place 1 Applicatorful vaginally at bedtime. Patient was given a sample of vaginal estrogen cream and instructed to apply 0.5mg  (pea-sized amount)  just inside the vaginal introitus with a finger-tip every night for two weeks and then Monday, Wednesday  and Friday nights. 01/08/15   Carollee HerterShannon A McGowan, PA-C  ipratropium (ATROVENT) 0.06 % nasal spray Place 2 sprays into the nose 4 (four) times daily. 05/20/15   Linna HoffJames D Kindl, MD  naproxen sodium (ANAPROX) 220 MG tablet Take 440 mg by mouth daily as needed. For pain    Historical Provider, MD  ondansetron (ZOFRAN) 4 MG tablet Take 1 tablet (4 mg total) by mouth every 6 (six) hours. Patient not taking: Reported on 01/08/2015 06/20/12   Marlon Peliffany Greene, PA-C  oxyCODONE-acetaminophen (PERCOCET/ROXICET) 5-325 MG per tablet Take 1 tablet by mouth every 6 (six) hours as needed for pain. Patient not taking: Reported on 01/08/2015  06/20/12   Marlon Peliffany Greene, PA-C  Polyethyl Glycol-Propyl Glycol (SYSTANE ULTRA) 0.4-0.3 % SOLN Place 2 drops into the right eye daily as needed. For dryness    Historical Provider, MD  sulfamethoxazole-trimethoprim (BACTRIM DS) 800-160 MG per tablet Take 1 tablet by mouth 2 (two) times daily.    Historical Provider, MD  VITAMIN E PO Take 1 capsule by mouth daily.    Historical Provider, MD   Meds Ordered and Administered this Visit  Medications - No data to display  BP 118/79 mmHg  Pulse 84  Temp(Src) 99.2 F (37.3 C) (Oral)  Resp 17  SpO2 99% No data found.   Physical Exam  Constitutional: She is oriented to person, place, and time. She appears well-developed and well-nourished.  HENT:  Head: Normocephalic.  Right Ear: External ear normal.  Left Ear: External ear normal.  Mouth/Throat: Oropharynx is clear and moist.  Neck: Normal range of motion. Neck supple.  Cardiovascular: Regular rhythm and normal heart sounds.   Pulmonary/Chest: Effort normal and breath sounds normal.  Lymphadenopathy:    She has no cervical adenopathy.  Neurological: She is alert and oriented to person, place, and time.  Skin: Skin is warm and dry.  Nursing note and vitals reviewed.   ED Course  Procedures (including critical care time)  Labs Review Labs Reviewed - No data to display  Imaging Review No results found.   Visual Acuity Review  Right Eye Distance:   Left Eye Distance:   Bilateral Distance:    Right Eye Near:   Left Eye Near:    Bilateral Near:         MDM   1. URI (upper respiratory infection)    rx for z-pack and atrovent and tussionex    Linna HoffJames D Kindl, MD 05/20/15 2024

## 2015-05-20 NOTE — ED Notes (Signed)
Pt  Reports     Cough    And  Congestion  Symptoms  Began this  Am            Family  Members   Has        pnuemonia

## 2015-10-26 ENCOUNTER — Encounter: Payer: Self-pay | Admitting: Emergency Medicine

## 2015-10-26 ENCOUNTER — Emergency Department
Admission: EM | Admit: 2015-10-26 | Discharge: 2015-10-26 | Disposition: A | Discharge status: Home / Self Care | Payer: BC Managed Care – PPO | Attending: Emergency Medicine | Admitting: Emergency Medicine

## 2015-10-26 DIAGNOSIS — Z91018 Allergy to other foods: Secondary | ICD-10-CM | POA: Diagnosis not present

## 2015-10-26 DIAGNOSIS — Z791 Long term (current) use of non-steroidal anti-inflammatories (NSAID): Secondary | ICD-10-CM | POA: Insufficient documentation

## 2015-10-26 DIAGNOSIS — Y9289 Other specified places as the place of occurrence of the external cause: Secondary | ICD-10-CM | POA: Insufficient documentation

## 2015-10-26 DIAGNOSIS — F329 Major depressive disorder, single episode, unspecified: Secondary | ICD-10-CM | POA: Diagnosis not present

## 2015-10-26 DIAGNOSIS — Z91013 Allergy to seafood: Secondary | ICD-10-CM | POA: Insufficient documentation

## 2015-10-26 DIAGNOSIS — Y999 Unspecified external cause status: Secondary | ICD-10-CM | POA: Diagnosis not present

## 2015-10-26 DIAGNOSIS — S0083XA Contusion of other part of head, initial encounter: Secondary | ICD-10-CM | POA: Insufficient documentation

## 2015-10-26 DIAGNOSIS — S0093XA Contusion of unspecified part of head, initial encounter: Secondary | ICD-10-CM

## 2015-10-26 DIAGNOSIS — R51 Headache: Secondary | ICD-10-CM | POA: Diagnosis present

## 2015-10-26 DIAGNOSIS — M62838 Other muscle spasm: Secondary | ICD-10-CM

## 2015-10-26 DIAGNOSIS — Z79899 Other long term (current) drug therapy: Secondary | ICD-10-CM | POA: Insufficient documentation

## 2015-10-26 DIAGNOSIS — Z7982 Long term (current) use of aspirin: Secondary | ICD-10-CM | POA: Insufficient documentation

## 2015-10-26 DIAGNOSIS — W010XXA Fall on same level from slipping, tripping and stumbling without subsequent striking against object, initial encounter: Secondary | ICD-10-CM | POA: Insufficient documentation

## 2015-10-26 DIAGNOSIS — Y9389 Activity, other specified: Secondary | ICD-10-CM | POA: Diagnosis not present

## 2015-10-26 DIAGNOSIS — S0990XA Unspecified injury of head, initial encounter: Secondary | ICD-10-CM

## 2015-10-26 NOTE — ED Provider Notes (Signed)
[facility] Emergency Department Provider Note  ____________________________________________  Time seen: Approximately 11:17 AM  I have reviewed the triage vital signs and the nursing notes.   HISTORY  Chief Complaint Fall    HPI Lori Maddox is a 65 y.o. female , NAD, presents to the emergency department with 2 day history of headache and stiff neck. States she had gone to get a pedicure on Saturday and was still wearing the thin makeshift flip-flops from the salon when she was wincing off her vehicle. States she slipped in the water and fell backwards. Believe she landed on her shoulders and neck as well as hit her head on the occipital region. Has had some neck and shoulder stiffness but no overt pain. She has had a posterior headache over the last 24 hours but the headache has not been the worst of her life nor has awoken her from sleep. Denies any thunderclap onset of this headache. Denies changes in vision, chest pain, shortness of breath, changes in speech, changes in gait. States she noted no open wounds or lacerations. Was bleeding from no site on her body. Denies any mid or lower back pain. Has not had any loss of bowel or bladder control. No saddle paresthesias.   Past Medical History  Diagnosis Date  . Vertigo   . Depression   . Hiatal hernia   . Microscopic hematuria   . Sinus headache   . Meniere disease   . TMJ syndrome   . Genital herpes   . Atrophic vaginitis   . Urethral caruncle   . Incomplete bladder emptying   . Back pain     Patient Active Problem List   Diagnosis Date Noted  . Microscopic hematuria 01/12/2015  . Atrophic vaginitis 01/12/2015  . Urethral caruncle 01/12/2015    Past Surgical History  Procedure Laterality Date  . Shoulder arthroscopy Left 2002  . Liposuction  2000  . Eye surgery  2000  . Tubal ligation  1983  . Cesarean section  1979/1982    2    Current Outpatient Rx  Name  Route  Sig  Dispense   Refill  . albuterol (PROAIR HFA) 108 (90 BASE) MCG/ACT inhaler   Inhalation   Inhale into the lungs.         Marland Kitchen aspirin 325 MG tablet   Oral   Take 325 mg by mouth daily.         Marland Kitchen azithromycin (ZITHROMAX Z-PAK) 250 MG tablet      Take as directed on pack   6 tablet   0   . benzonatate (TESSALON) 100 MG capsule   Oral   Take by mouth.         . dextromethorphan (DELSYM) 30 MG/5ML liquid   Oral   Take 10 mLs (60 mg total) by mouth 2 (two) times daily.   89 mL   1   . estradiol (ESTRACE) 0.1 MG/GM vaginal cream   Vaginal   Place 1 Applicatorful vaginally at bedtime. Patient was given a sample of vaginal estrogen cream and instructed to apply 0.5mg  (pea-sized amount)  just inside the vaginal introitus with a finger-tip every night for two weeks and then Monday, Wednesday and Friday nights.   42.5 g   12   . ipratropium (ATROVENT) 0.06 % nasal spray   Nasal   Place 2 sprays into the nose 4 (four) times daily.   15 mL   1   . naproxen sodium (ANAPROX) 220 MG  tablet   Oral   Take 440 mg by mouth daily as needed. For pain         . ondansetron (ZOFRAN) 4 MG tablet   Oral   Take 1 tablet (4 mg total) by mouth every 6 (six) hours. Patient not taking: Reported on 01/08/2015   12 tablet   0   . oxyCODONE-acetaminophen (PERCOCET/ROXICET) 5-325 MG per tablet   Oral   Take 1 tablet by mouth every 6 (six) hours as needed for pain. Patient not taking: Reported on 01/08/2015   15 tablet   0   . Polyethyl Glycol-Propyl Glycol (SYSTANE ULTRA) 0.4-0.3 % SOLN   Right Eye   Place 2 drops into the right eye daily as needed. For dryness         . sulfamethoxazole-trimethoprim (BACTRIM DS) 800-160 MG per tablet   Oral   Take 1 tablet by mouth 2 (two) times daily.         Marland Kitchen. VITAMIN E PO   Oral   Take 1 capsule by mouth daily.           Allergies Iodinated diagnostic agents; Shellfish allergy; and Strawberry extract  Family History  Problem Relation Age of  Onset  . Diabetes Mother   . Cancer Father     Pancreatic  . Cancer Sister   . Diabetes Brother   . Hematuria Mother   . Uterine cancer Mother   . Bladder Cancer Mother   . Kidney disease Neg Hx     Social History Social History  Substance Use Topics  . Smoking status: Never Smoker   . Smokeless tobacco: None  . Alcohol Use: 0.0 oz/week    0 Standard drinks or equivalent per week     Comment: occasional     Review of Systems  Constitutional: No fever/chills, fatigue Eyes: No visual changes or loss of vision.  ENT: No tinnitus, drainage from ears or nose. Cardiovascular: No chest pain or palpitations. Respiratory: No cough. No shortness of breath. No wheezing.  Gastrointestinal: No abdominal pain.  No nausea, vomiting.  Musculoskeletal: Positive for neck, shoulder pain. Negative for back pain.  Skin: Negative for rash, bruising, open wounds, lacerations. Neurological: Negative for headaches, focal weakness or numbness. No LOC, dizziness. No saddle paresthesias. No tingling. No loss of bowel or bladder control 10-point ROS otherwise negative.  ____________________________________________   PHYSICAL EXAM:  VITAL SIGNS: ED Triage Vitals  Enc Vitals Group     BP 10/26/15 1053 115/63 mmHg     Pulse Rate 10/26/15 1053 84     Resp 10/26/15 1053 20     Temp 10/26/15 1053 98.1 F (36.7 C)     Temp Source 10/26/15 1053 Oral     SpO2 10/26/15 1053 100 %     Weight 10/26/15 1053 165 lb (74.844 kg)     Height 10/26/15 1053 5\' 3"  (1.6 m)     Head Cir --      Peak Flow --      Pain Score 10/26/15 1051 7     Pain Loc --      Pain Edu? --      Excl. in GC? --     Constitutional: Alert and oriented. Well appearing and in no acute distress. Eyes: Conjunctivae are normal. PERRLA. EOMI without pain.  Head: Atraumatic. ENT:      Ears: No discharge noted about bilateral ear canals.      Nose: No congestion/rhinnorhea.      Mouth/Throat:  Mucous membranes are moist.  Neck: No  stridor. No cervical spine tenderness to palpation. Supple with full range of motion. Mild trapezial muscle spasm appreciated bilaterally. Hematological/Lymphatic/Immunilogical: No cervical lymphadenopathy. Cardiovascular: Normal rate, regular rhythm. Normal S1 and S2. No murmurs, rubs, gallops. Good peripheral circulation with 2+ pulses noted in bilateral upper and lower extremities. Respiratory: Normal respiratory effort without tachypnea or retractions. Lungs CTAB with breath sounds noted throughout all lung fields. Gastrointestinal: Soft and nontender. No distention. No CVA tenderness. Musculoskeletal: Mild tenderness to the occipital region of the head without crepitus nor other bony abnormalities to palpation. No edema or hematoma appreciated in this area. Full range of motion of bilateral upper extremities without pain or stiffness. No lower extremity tenderness nor edema.  No joint effusions. Grip strength equal bilaterally in the upper extremities. Negative straight leg raise bilaterally. Neurologic:  Normal speech and language. No gross focal neurologic deficits are appreciated. CN III-XII grossly in tact  Skin:  Skin is warm, dry and intact. No rash, lacerations, bruising noted. Psychiatric: Mood and affect are normal. Speech and behavior are normal. Patient exhibits appropriate insight and judgement.   ____________________________________________   LABS  None ____________________________________________  EKG  None ____________________________________________  RADIOLOGY  None ____________________________________________    PROCEDURES  Procedure(s) performed: None      Medications - No data to display   ____________________________________________   INITIAL IMPRESSION / ASSESSMENT AND PLAN / ED COURSE  Patient's diagnosis is consistent with contusion of head, muscle spasms of neck and head injury due to fall. Patient's exam today was reassuring and overall  benign. No evidence of any neurologic deficits on physical exam which was discussed with the patient and we both agreed CT scan was not necessary at this time. Patient may take Tylenol as needed for pain. Discussed and modeled light range of motion and stretching exercises to complete 2-3 times daily to decrease pain and spasms. Patient may follow-up with her primary care provider for general ED follow-up within 2 days as needed. Patient verbalized understanding that if any new symptoms arise or current symptoms worsen she is to return to this emergency department for further evaluation. Patient is given ED precautions to return to the ED for any worsening or new symptoms.    ____________________________________________  FINAL CLINICAL IMPRESSION(S) / ED DIAGNOSES  Final diagnoses:  Contusion of head, initial encounter  Muscle spasms of neck  Head injury, initial encounter      NEW MEDICATIONS STARTED DURING THIS VISIT:  Discharge Medication List as of 10/26/2015 11:32 AM           Hope Pigeon, PA-C 10/26/15 1148  Jene Every, MD 10/26/15 1440

## 2015-10-26 NOTE — Discharge Instructions (Signed)
Facial or Scalp Contusion  A facial or scalp contusion is a deep bruise on the face or head. Contusions happen when an injury causes bleeding under the skin. Signs of bruising include pain, puffiness (swelling), and discolored skin. The contusion may turn blue, purple, or yellow. HOME CARE  Only take medicines as told by your doctor.  Put ice on the injured area.  Put ice in a plastic bag.  Place a towel between your skin and the bag.  Leave the ice on for 20 minutes, 2-3 times a day. GET HELP IF:  You have bite problems.  You have pain when chewing.  You are worried about your face not healing normally. GET HELP RIGHT AWAY IF:   You have severe pain or a headache and medicine does not help.  You are very tired or confused, or your personality changes.  You throw up (vomit).  You have a nosebleed that will not stop.  You see two of everything (double vision) or have blurry vision.  You have fluid coming from your nose or ear.  You have problems walking or using your arms or legs. MAKE SURE YOU:   Understand these instructions.  Will watch your condition.  Will get help right away if you are not doing well or get worse.   This information is not intended to replace advice given to you by your health care provider. Make sure you discuss any questions you have with your health care provider.   Document Released: 06/23/2011 Document Revised: 07/25/2014 Document Reviewed: 02/14/2013 Elsevier Interactive Patient Education 2016 [place] Injury, Adult You have a head injury. Headaches and throwing up (vomiting) are common after a head injury. It should be easy to wake up from sleeping. Sometimes you must stay in the hospital. Most problems happen within the first 24 hours. Side effects may occur up to 7-10 days after the injury.  WHAT ARE THE TYPES OF HEAD INJURIES? Head injuries can be as minor as a bump. Some head injuries can be more severe. More severe head  injuries include:  A jarring injury to the brain (concussion).  A bruise of the brain (contusion). This mean there is bleeding in the brain that can cause swelling.  A cracked skull (skull fracture).  Bleeding in the brain that collects, clots, and forms a bump (hematoma). WHEN SHOULD I GET HELP RIGHT AWAY?   You are confused or sleepy.  You cannot be woken up.  You feel sick to your stomach (nauseous) or keep throwing up (vomiting).  Your dizziness or unsteadiness is getting worse.  You have very bad, lasting headaches that are not helped by medicine. Take medicines only as told by your doctor.  You cannot use your arms or legs like normal.  You cannot walk.  You notice changes in the black spots in the center of the colored part of your eye (pupil).  You have clear or bloody fluid coming from your nose or ears.  You have trouble seeing. During the next 24 hours after the injury, you must stay with someone who can watch you. This person should get help right away (call 911 in the [place] if you start to shake and are not able to control it (have seizures), you pass out, or you are unable to wake up. HOW CAN I PREVENT A HEAD INJURY IN THE FUTURE?  Wear seat belts.  Wear a helmet while bike riding and playing sports like football.  Stay away from dangerous  activities around the house. WHEN CAN I RETURN TO NORMAL ACTIVITIES AND ATHLETICS? See your doctor before doing these activities. You should not do normal activities or play contact sports until 1 week after the following symptoms have stopped:  Headache that does not go away.  Dizziness.  Poor attention.  Confusion.  Memory problems.  Sickness to your stomach or throwing up.  Tiredness.  Fussiness.  Bothered by bright lights or loud noises.  Anxiousness or depression.  Restless sleep. MAKE SURE YOU:   Understand these instructions.  Will watch your condition.  Will get help right away if you are not  doing well or get worse.   This information is not intended to replace advice given to you by your health care provider. Make sure you discuss any questions you have with your health care provider.   Document Released: 06/16/2008 Document Revised: 07/25/2014 Document Reviewed: 03/11/2013 Elsevier Interactive Patient Education 2016 [employer]  Cryotherapy Cryotherapy is when you put ice on your injury. Ice helps lessen pain and puffiness (swelling) after an injury. Ice works the best when you start using it in the first 24 to 48 hours after an injury. HOME CARE  Put a dry or damp towel between the ice pack and your skin.  You may press gently on the ice pack.  Leave the ice on for no more than 10 to 20 minutes at a time.  Check your skin after 5 minutes to make sure your skin is okay.  Rest at least 20 minutes between ice pack uses.  Stop using ice when your skin loses feeling (numbness).  Do not use ice on someone who cannot tell you when it hurts. This includes small children and people with memory problems (dementia). GET HELP RIGHT AWAY IF:  You have white spots on your skin.  Your skin turns blue or pale.  Your skin feels waxy or hard.  Your puffiness gets worse. MAKE SURE YOU:   Understand these instructions.  Will watch your condition.  Will get help right away if you are not doing well or get worse.   This information is not intended to replace advice given to you by your health care provider. Make sure you discuss any questions you have with your health care provider.   Document Released: 12/21/2007 Document Revised: 09/26/2011 Document Reviewed: 02/24/2011 Elsevier Interactive Patient Education 2016 [employer].  Concussion, Adult A concussion is a brain injury. It is caused by:  A hit to the head.  A quick and sudden movement (jolt) of the head or neck. A concussion is usually not life threatening. Even so, it can cause serious problems. If you had  a concussion before, you may have concussion-like problems after a hit to your head. HOME CARE General Instructions  Follow your doctor's directions carefully.  Take medicines only as told by your doctor.  Only take medicines your doctor says are safe.  Do not drink alcohol until your doctor says it is okay. Alcohol and some drugs can slow down healing. They can also put you at risk for further injury.  If you are having trouble remembering things, write them down.  Try to do one thing at a time if you get distracted easily. For example, do not watch TV while making dinner.  Talk to your family members or close friends when making important decisions.  Follow up with your doctor as told.  Watch your symptoms. Tell others to do the same. Serious problems can sometimes happen  after a concussion. Older adults are more likely to have these problems.  Tell your teachers, school nurse, school counselor, coach, Event organiser, or work Production designer, theatre/television/film about your concussion. Tell them about what you can or cannot do. They should watch to see if:  It gets even harder for you to pay attention or concentrate.  It gets even harder for you to remember things or learn new things.  You need more time than normal to finish things.  You become annoyed (irritable) more than before.  You are not able to deal with stress as well.  You have more problems than before.  Rest. Make sure you:  Get plenty of sleep at night.  Go to sleep early.  Go to bed at the same time every day. Try to wake up at the same time.  Rest during the day.  Take naps when you feel tired.  Limit activities where you have to think a lot or concentrate. These include:  Doing homework.  Doing work related to a job.  Watching TV.  Using the computer. Returning To Your Regular Activities Return to your normal activities slowly, not all at once. You must give your body and brain enough time to heal.   Do not play  sports or do other athletic activities until your doctor says it is okay.  Ask your doctor when you can drive, ride a bicycle, or work other vehicles or machines. Never do these things if you feel dizzy.  Ask your doctor about when you can return to work or school. Preventing Another Concussion It is very important to avoid another brain injury, especially before you have healed. In rare cases, another injury can lead to permanent brain damage, brain swelling, or death. The risk of this is greatest during the first 7-10 days after your injury. Avoid injuries by:   Wearing a seat belt when riding in a car.  Not drinking too much alcohol.  Avoiding activities that could lead to a second concussion (such as contact sports).  Wearing a helmet when doing activities like:  Biking.  Skiing.  Skateboarding.  Skating.  Making your home safer by:  Removing things from the floor or stairways that could make you trip.  Using grab bars in bathrooms and handrails by stairs.  Placing non-slip mats on floors and in bathtubs.  Improve lighting in dark areas. GET HELP IF:  It gets even harder for you to pay attention or concentrate.  It gets even harder for you to remember things or learn new things.  You need more time than normal to finish things.  You become annoyed (irritable) more than before.  You are not able to deal with stress as well.  You have more problems than before.  You have problems keeping your balance.  You are not able to react quickly when you should. Get help if you have any of these problems for more than 2 weeks:   Lasting (chronic) headaches.  Dizziness or trouble balancing.  Feeling sick to your stomach (nausea).  Seeing (vision) problems.  Being affected by noises or light more than normal.  Feeling sad, low, down in the dumps, blue, gloomy, or empty (depressed).  Mood changes (mood swings).  Feeling of fear or nervousness about what may  happen (anxiety).  Feeling annoyed.  Memory problems.  Problems concentrating or paying attention.  Sleep problems.  Feeling tired all the time. GET HELP RIGHT AWAY IF:   You have bad headaches or your headaches  get worse.  You have weakness (even if it is in one hand, leg, or part of the face).  You have loss of feeling (numbness).  You feel off balance.  You keep throwing up (vomiting).  You feel tired.  One black center of your eye (pupil) is larger than the other.  You twitch or shake violently (convulse).  Your speech is not clear (slurred).  You are more confused, easily angered (agitated), or annoyed than before.  You have more trouble resting than before.  You are unable to recognize people or places.  You have neck pain.  It is difficult to wake you up.  You have unusual behavior changes.  You pass out (lose consciousness). MAKE SURE YOU:   Understand these instructions.  Will watch your condition.  Will get help right away if you are not doing well or get worse.   This information is not intended to replace advice given to you by your health care provider. Make sure you discuss any questions you have with your health care provider.   Document Released: 06/22/2009 Document Revised: 07/25/2014 Document Reviewed: 01/24/2013 Elsevier Interactive Patient Education [employer]2016 [employer] Inc[employer]

## 2015-10-26 NOTE — ED Notes (Addendum)
Pt states she had mechanical fall Saturday afternoon, states she was abnormally sleepy yesterday and has had headache and stiff neck since fall. No hematoma noted. Pt is nervous and would like to be evaluated. Pt A&O. Denies LOC with fall, or n/v .

## 2015-10-26 NOTE — ED Notes (Signed)
Pt to ed with c/o fall on sat.  Pt states she slipped and fell on water.  Pt now c/o neck and shoulder pain.

## 2015-11-08 ENCOUNTER — Emergency Department
Admission: EM | Admit: 2015-11-08 | Discharge: 2015-11-08 | Disposition: A | Discharge status: Home / Self Care | Payer: BC Managed Care – PPO | Attending: Emergency Medicine | Admitting: Emergency Medicine

## 2015-11-08 ENCOUNTER — Encounter: Payer: Self-pay | Admitting: Emergency Medicine

## 2015-11-08 ENCOUNTER — Emergency Department: Payer: BC Managed Care – PPO

## 2015-11-08 DIAGNOSIS — Y939 Activity, unspecified: Secondary | ICD-10-CM | POA: Insufficient documentation

## 2015-11-08 DIAGNOSIS — W010XXA Fall on same level from slipping, tripping and stumbling without subsequent striking against object, initial encounter: Secondary | ICD-10-CM | POA: Diagnosis not present

## 2015-11-08 DIAGNOSIS — F329 Major depressive disorder, single episode, unspecified: Secondary | ICD-10-CM | POA: Insufficient documentation

## 2015-11-08 DIAGNOSIS — Y929 Unspecified place or not applicable: Secondary | ICD-10-CM | POA: Diagnosis not present

## 2015-11-08 DIAGNOSIS — Z79899 Other long term (current) drug therapy: Secondary | ICD-10-CM | POA: Insufficient documentation

## 2015-11-08 DIAGNOSIS — Z7982 Long term (current) use of aspirin: Secondary | ICD-10-CM | POA: Diagnosis not present

## 2015-11-08 DIAGNOSIS — Y999 Unspecified external cause status: Secondary | ICD-10-CM | POA: Insufficient documentation

## 2015-11-08 DIAGNOSIS — M50823 Other cervical disc disorders at C6-C7 level: Secondary | ICD-10-CM | POA: Diagnosis not present

## 2015-11-08 DIAGNOSIS — S060X0A Concussion without loss of consciousness, initial encounter: Secondary | ICD-10-CM

## 2015-11-08 DIAGNOSIS — M50822 Other cervical disc disorders at C5-C6 level: Secondary | ICD-10-CM | POA: Insufficient documentation

## 2015-11-08 DIAGNOSIS — S0990XA Unspecified injury of head, initial encounter: Secondary | ICD-10-CM | POA: Diagnosis present

## 2015-11-08 NOTE — Discharge Instructions (Signed)
Post-Concussion Syndrome  Post-concussion syndrome describes the symptoms that can occur after a head injury. These symptoms can last from weeks to months.  CAUSES   It is not clear why some head injuries cause post-concussion syndrome. It can occur whether your head injury was mild or severe and whether you were wearing head protection or not.   SIGNS AND SYMPTOMS  · Memory difficulties.  · Dizziness.  · Headaches.  · Double vision or blurry vision.  · Sensitivity to light.  · Hearing difficulties.  · Depression.  · Tiredness.  · Weakness.  · Difficulty with concentration.  · Difficulty sleeping or staying asleep.  · Vomiting.  · Poor balance or instability on your feet.  · Slow reaction time.  · Difficulty learning and remembering things you have heard.  DIAGNOSIS   There is no test to determine whether you have post-concussion syndrome. Your health care provider may order an imaging scan of your brain, such as a CT scan, to check for other problems that may be causing your symptoms (such as a severe injury inside your skull).  TREATMENT   Usually, these problems disappear over time without medical care. Your health care provider may prescribe medicine to help ease your symptoms. It is important to follow up with a neurologist to evaluate your recovery and address any lingering symptoms or issues.  HOME CARE INSTRUCTIONS   · Take medicines only as directed by your health care provider. Do not take aspirin. Aspirin can slow blood clotting.  · Sleep with your head slightly elevated to help with headaches.  · Avoid any situation where there is potential for another head injury. This includes football, hockey, soccer, basketball, martial arts, downhill snow sports, and horseback riding. Your condition will get worse every time you experience a concussion. You should avoid these activities until you are evaluated by the appropriate follow-up health care providers.  · Keep all follow-up visits as directed by your health  care provider. This is important.  SEEK MEDICAL CARE IF:  · You have increased problems paying attention or concentrating.  · You have increased difficulty remembering or learning new information.  · You need more time to complete tasks or assignments than before.  · You have increased irritability or decreased ability to cope with stress.  · You have more symptoms than before.  Seek medical care if you have any of the following symptoms for more than two weeks after your injury:  · Lasting (chronic) headaches.  · Dizziness or balance problems.  · Nausea.  · Vision problems.  · Increased sensitivity to noise or light.  · Depression or mood swings.  · Anxiety or irritability.  · Memory problems.  · Difficulty concentrating or paying attention.  · Sleep problems.  · Feeling tired all the time.  SEEK IMMEDIATE MEDICAL CARE IF:  · You have confusion or unusual drowsiness.  · Others find it difficult to wake you up.  · You have nausea or persistent, forceful vomiting.  · You feel like you are moving when you are not (vertigo). Your eyes may move rapidly back and forth.  · You have convulsions or faint.  · You have severe, persistent headaches that are not relieved by medicine.  · You cannot use your arms or legs normally.  · One of your pupils is larger than the other.  · You have clear or bloody discharge from your nose or ears.  · Your problems are getting worse, not better.  MAKE   SURE YOU:  · Understand these instructions.  · Will watch your condition.  · Will get help right away if you are not doing well or get worse.     This information is not intended to replace advice given to you by your health care provider. Make sure you discuss any questions you have with your health care provider.     Document Released: 12/24/2001 Document Revised: 07/25/2014 Document Reviewed: 10/09/2013  Elsevier Interactive Patient Education ©2016 [employer] Inc[employer]

## 2015-11-08 NOTE — ED Provider Notes (Signed)
Time Seen: Approximately 1505  I have reviewed the triage notes  Chief Complaint: Head Injury   History of Present Illness: Lori Maddox is a 65 y.o. female who states that she was here 3 weeks ago and had a very thorough neurologic exam after having a non-syncopal head trauma. She states she was in the garage and her feet went out from under her and she fell backwards hitting primarily on her upper shoulders and then struck her head on the concrete. She denies any loss of consciousness though states that she's had some persistent pain from the occipital area and points mainly toward the right side. She denies any persistent nausea or vomiting but states that she's had trouble finding words on occasion. That she's had some numbness and tingling especially in her right arm and hand. She denies any weakness or any persistence of symptoms. She denies any ataxia. She states she is concerned and would like to have a head CT. She states she's noticed limited range of motion of her neck and it seems to be more and more stiff in nature.   Past Medical History  Diagnosis Date  . Vertigo   . Depression   . Hiatal hernia   . Microscopic hematuria   . Sinus headache   . Meniere disease   . TMJ syndrome   . Genital herpes   . Atrophic vaginitis   . Urethral caruncle   . Incomplete bladder emptying   . Back pain     Patient Active Problem List   Diagnosis Date Noted  . Microscopic hematuria 01/12/2015  . Atrophic vaginitis 01/12/2015  . Urethral caruncle 01/12/2015    Past Surgical History  Procedure Laterality Date  . Shoulder arthroscopy Left 2002  . Liposuction  2000  . Eye surgery  2000  . Tubal ligation  1983  . Cesarean section  1979/1982    2    Past Surgical History  Procedure Laterality Date  . Shoulder arthroscopy Left 2002  . Liposuction  2000  . Eye surgery  2000  . Tubal ligation  1983  . Cesarean section  1979/1982    2    Current Outpatient Rx  Name  Route   Sig  Dispense  Refill  . albuterol (PROAIR HFA) 108 (90 BASE) MCG/ACT inhaler   Inhalation   Inhale into the lungs.         Marland Kitchen aspirin 325 MG tablet   Oral   Take 325 mg by mouth daily.         Marland Kitchen azithromycin (ZITHROMAX Z-PAK) 250 MG tablet      Take as directed on pack   6 tablet   0   . benzonatate (TESSALON) 100 MG capsule   Oral   Take by mouth.         . dextromethorphan (DELSYM) 30 MG/5ML liquid   Oral   Take 10 mLs (60 mg total) by mouth 2 (two) times daily.   89 mL   1   . estradiol (ESTRACE) 0.1 MG/GM vaginal cream   Vaginal   Place 1 Applicatorful vaginally at bedtime. Patient was given a sample of vaginal estrogen cream and instructed to apply 0.5mg  (pea-sized amount)  just inside the vaginal introitus with a finger-tip every night for two weeks and then Monday, Wednesday and Friday nights.   42.5 g   12   . ipratropium (ATROVENT) 0.06 % nasal spray   Nasal   Place 2 sprays into the nose 4 (  four) times daily.   15 mL   1   . naproxen sodium (ANAPROX) 220 MG tablet   Oral   Take 440 mg by mouth daily as needed. For pain         . ondansetron (ZOFRAN) 4 MG tablet   Oral   Take 1 tablet (4 mg total) by mouth every 6 (six) hours. Patient not taking: Reported on 01/08/2015   12 tablet   0   . oxyCODONE-acetaminophen (PERCOCET/ROXICET) 5-325 MG per tablet   Oral   Take 1 tablet by mouth every 6 (six) hours as needed for pain. Patient not taking: Reported on 01/08/2015   15 tablet   0   . Polyethyl Glycol-Propyl Glycol (SYSTANE ULTRA) 0.4-0.3 % SOLN   Right Eye   Place 2 drops into the right eye daily as needed. For dryness         . sulfamethoxazole-trimethoprim (BACTRIM DS) 800-160 MG per tablet   Oral   Take 1 tablet by mouth 2 (two) times daily.         Marland Kitchen VITAMIN E PO   Oral   Take 1 capsule by mouth daily.           Allergies:  Iodinated diagnostic agents; Shellfish allergy; Ibuprofen; and Strawberry extract  Family  History: Family History  Problem Relation Age of Onset  . Diabetes Mother   . Cancer Father     Pancreatic  . Cancer Sister   . Diabetes Brother   . Hematuria Mother   . Uterine cancer Mother   . Bladder Cancer Mother   . Kidney disease Neg Hx     Social History: Social History  Substance Use Topics  . Smoking status: Never Smoker   . Smokeless tobacco: None  . Alcohol Use: 0.0 oz/week    0 Standard drinks or equivalent per week     Comment: occasional     Review of Systems:   10 point review of systems was performed and was otherwise negative:  Constitutional: No fever Eyes: No visual disturbances ENT: No sore throat, ear pain Cardiac: No chest pain Respiratory: No shortness of breath, wheezing, or stridor Abdomen: No abdominal pain, no vomiting, No diarrhea Endocrine: No weight loss, No night sweats Extremities: No peripheral edema, cyanosis Skin: No rashes, easy bruising Neurologic: No focal weakness, trouble with speech or swollowing Urologic: No dysuria, Hematuria, or urinary frequency Patient describes some mild pain up her right-sided at the top of the cervical spine  Physical Exam:  ED Triage Vitals  Enc Vitals Group     BP 11/08/15 1422 129/75 mmHg     Pulse Rate 11/08/15 1422 68     Resp --      Temp 11/08/15 1422 98.3 F (36.8 C)     Temp Source 11/08/15 1422 Oral     SpO2 11/08/15 1422 99 %     Weight 11/08/15 1426 163 lb (73.936 kg)     Height 11/08/15 1426  (1.6 m)     Head Cir --      Peak Flow --      Pain Score 11/08/15 1426 7     Pain Loc --      Pain Edu? --      Excl. in GC? --     General: Awake , Alert , and Oriented times 3; GCS 15 Head: Normal cephalic , atraumatic patient has some point tenderness right occiput without any skull table step-off or crepitus noted Eyes:  Pupils equal , round, reactive to light; no papilledema Nose/Throat: No nasal drainage, patent upper airway without erythema or exudate.  Neck: Supple, Full  range of motion, No anterior adenopathy or palpable thyroid masses. Mild tenderness again at the base of the occiput toward the right perispinal muscle region. No central C-spine tenderness or neuropraxia Lungs: Clear to ascultation without wheezes , rhonchi, or rales Heart: Regular rate, regular rhythm without murmurs , gallops , or rubs Neurologic: normal ambulation, Motor symmetric without deficits, sensory intact Skin: warm, dry, no rashes No reproducible thoracic or lumbar spine pain   Radiology:   CT HEAD WITHOUT CONTRAST  CT CERVICAL SPINE WITHOUT CONTRAST  TECHNIQUE: Multidetector CT imaging of the head and cervical spine was performed following the standard protocol without intravenous contrast. Multiplanar CT image reconstructions of the cervical spine were also generated.  COMPARISON: 05/19/2009  FINDINGS: CT HEAD FINDINGS  No evidence of intracranial hemorrhage, brain edema, or other signs of acute infarction. No evidence of intracranial mass lesion or mass effect.  No abnormal extraaxial fluid collections identified. Ventricles are normal in size. No skull abnormality identified.  CT CERVICAL SPINE FINDINGS  No evidence of acute fracture, subluxation, or prevertebral soft tissue swelling.  Mild to moderate degenerative disc disease is seen at the C5-6 and C6-7. Other intervertebral disc spaces are maintained. No evidence of facet arthropathy or other significant bone abnormality. Mild atlantoaxial degenerative changes are noted.  IMPRESSION: Negative noncontrast head CT.  No evidence of cervical spine fracture or subluxation. Mild C5-6 and C6-7 degenerative disc disease.   Electronically Signed By: Myles Rosenthal M.D. On: 11/08/2015 15:45          CT Cervical Spine Wo Contrast (Final result) Result time: 11/08/15 15:45:01   Final result by Rad Results In Interface (11/08/15 15:45:01)   Narrative:   CLINICAL DATA: Fall in garage 2 weeks  ago. Persistent head and neck pain. Initial encounter.  EXAM: CT HEAD WITHOUT CONTRAST  CT CERVICAL SPINE WITHOUT CONTRAST  TECHNIQUE: Multidetector CT imaging of the head and cervical spine was performed following the standard protocol without intravenous contrast. Multiplanar CT image reconstructions of the cervical spine were also generated.  COMPARISON: 05/19/2009  FINDINGS: CT HEAD FINDINGS  No evidence of intracranial hemorrhage, brain edema, or other signs of acute infarction. No evidence of intracranial mass lesion or mass effect.  No abnormal extraaxial fluid collections identified. Ventricles are normal in size. No skull abnormality identified.  CT CERVICAL SPINE FINDINGS  No evidence of acute fracture, subluxation, or prevertebral soft tissue swelling.  Mild to moderate degenerative disc disease is seen at the C5-6 and C6-7. Other intervertebral disc spaces are maintained. No evidence of facet arthropathy or other significant bone abnormality. Mild atlantoaxial degenerative changes are noted.  IMPRESSION: Negative noncontrast head CT.  No evidence of cervical spine fracture or subluxation. Mild C5-6 and C6-7 degenerative disc disease.         I personally reviewed the radiologic studies    ED Course:  Patient's stay here was uneventful and fortunately her studies objectively looked negative. Patient was given instructions concerning postconcussive syndrome. Patient was advised to continue with over-the-counter ibuprofen for neck and headache discomfort.    Assessment:  Postconcussive syndrome   F   Plan: * Outpatient management Patient was advised to return immediately if condition worsens. Patient was advised to follow up with their primary care physician or other specialized physicians involved in their outpatient care. The patient and/or family member/power of attorney had  laboratory results reviewed at the bedside. All questions and  concerns were addressed and appropriate discharge instructions were distributed by the nursing staff.            Jennye MoccasinBrian S Sincere Berlanga, MD 11/08/15 [phone]

## 2015-11-08 NOTE — ED Notes (Addendum)
Patient states she was here about 3 weeks ago for same sxs.  Patient states she slipped in the garage and hit the occipital part of her head.  Patient states she hasn't been sleeping and has had periods of tingling in her arm. Patient states she wanted a CT scan last visit but was told she did not need one.  Patient states she is back for reassurance and would like a CT scan of her head. Pt states the pain has not changed since last visit.

## 2015-11-24 ENCOUNTER — Emergency Department (HOSPITAL_COMMUNITY): Payer: BC Managed Care – PPO

## 2015-11-24 ENCOUNTER — Emergency Department (HOSPITAL_COMMUNITY)
Admission: EM | Admit: 2015-11-24 | Discharge: 2015-11-24 | Disposition: A | Discharge status: Home / Self Care | Payer: BC Managed Care – PPO | Attending: Emergency Medicine | Admitting: Emergency Medicine

## 2015-11-24 ENCOUNTER — Encounter (HOSPITAL_COMMUNITY): Payer: Self-pay | Admitting: Emergency Medicine

## 2015-11-24 DIAGNOSIS — Z8742 Personal history of other diseases of the female genital tract: Secondary | ICD-10-CM | POA: Diagnosis not present

## 2015-11-24 DIAGNOSIS — Z7982 Long term (current) use of aspirin: Secondary | ICD-10-CM | POA: Diagnosis not present

## 2015-11-24 DIAGNOSIS — S0990XA Unspecified injury of head, initial encounter: Secondary | ICD-10-CM | POA: Insufficient documentation

## 2015-11-24 DIAGNOSIS — S233XXA Sprain of ligaments of thoracic spine, initial encounter: Secondary | ICD-10-CM | POA: Insufficient documentation

## 2015-11-24 DIAGNOSIS — Y92410 Unspecified street and highway as the place of occurrence of the external cause: Secondary | ICD-10-CM | POA: Insufficient documentation

## 2015-11-24 DIAGNOSIS — Z79899 Other long term (current) drug therapy: Secondary | ICD-10-CM | POA: Insufficient documentation

## 2015-11-24 DIAGNOSIS — Z8619 Personal history of other infectious and parasitic diseases: Secondary | ICD-10-CM | POA: Insufficient documentation

## 2015-11-24 DIAGNOSIS — Z8669 Personal history of other diseases of the nervous system and sense organs: Secondary | ICD-10-CM | POA: Insufficient documentation

## 2015-11-24 DIAGNOSIS — Y998 Other external cause status: Secondary | ICD-10-CM | POA: Insufficient documentation

## 2015-11-24 DIAGNOSIS — Z8659 Personal history of other mental and behavioral disorders: Secondary | ICD-10-CM | POA: Insufficient documentation

## 2015-11-24 DIAGNOSIS — Z792 Long term (current) use of antibiotics: Secondary | ICD-10-CM | POA: Insufficient documentation

## 2015-11-24 DIAGNOSIS — S239XXA Sprain of unspecified parts of thorax, initial encounter: Secondary | ICD-10-CM

## 2015-11-24 DIAGNOSIS — S29002A Unspecified injury of muscle and tendon of back wall of thorax, initial encounter: Secondary | ICD-10-CM | POA: Diagnosis present

## 2015-11-24 DIAGNOSIS — Y9389 Activity, other specified: Secondary | ICD-10-CM | POA: Insufficient documentation

## 2015-11-24 DIAGNOSIS — Z8739 Personal history of other diseases of the musculoskeletal system and connective tissue: Secondary | ICD-10-CM | POA: Diagnosis not present

## 2015-11-24 DIAGNOSIS — Z8719 Personal history of other diseases of the digestive system: Secondary | ICD-10-CM | POA: Insufficient documentation

## 2015-11-24 MED ORDER — CYCLOBENZAPRINE HCL 5 MG PO TABS: 5.0000 mg | ORAL_TABLET | Freq: Two times a day (BID) | ORAL | Status: DC | PRN
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
## 2015-11-24 NOTE — Discharge Instructions (Signed)

## 2015-12-24 DIAGNOSIS — R410 Disorientation, unspecified: Secondary | ICD-10-CM | POA: Insufficient documentation

## 2015-12-24 DIAGNOSIS — G44329 Chronic post-traumatic headache, not intractable: Secondary | ICD-10-CM | POA: Insufficient documentation

## 2015-12-24 DIAGNOSIS — F0781 Postconcussional syndrome: Secondary | ICD-10-CM | POA: Insufficient documentation

## 2015-12-24 DIAGNOSIS — R42 Dizziness and giddiness: Secondary | ICD-10-CM | POA: Insufficient documentation

## 2016-01-12 ENCOUNTER — Encounter: Payer: Self-pay | Admitting: Urology

## 2016-01-12 ENCOUNTER — Ambulatory Visit (INDEPENDENT_AMBULATORY_CARE_PROVIDER_SITE_OTHER): Payer: BC Managed Care – PPO | Admitting: Urology

## 2016-01-12 VITALS — BP 108/71 | HR 80 | Ht 63.0 in | Wt 164.2 lb

## 2016-01-12 DIAGNOSIS — R3129 Other microscopic hematuria: Secondary | ICD-10-CM | POA: Diagnosis not present

## 2016-01-12 DIAGNOSIS — N362 Urethral caruncle: Secondary | ICD-10-CM

## 2016-01-12 DIAGNOSIS — N952 Postmenopausal atrophic vaginitis: Secondary | ICD-10-CM | POA: Diagnosis not present

## 2016-01-12 LAB — MICROSCOPIC EXAMINATION

## 2016-01-12 LAB — URINALYSIS, COMPLETE
Bilirubin, UA: NEGATIVE
Glucose, UA: NEGATIVE
Ketones, UA: NEGATIVE
NITRITE UA: NEGATIVE
PH UA: 5.5 (ref 5.0–7.5)
Protein, UA: NEGATIVE
Specific Gravity, UA: 1.02 (ref 1.005–1.030)
UUROB: 0.2 mg/dL (ref 0.2–1.0)

## 2016-01-12 MED ORDER — RANITIDINE HCL 150 MG PO TABS: ORAL_TABLET | ORAL | Status: DC

## 2016-01-12 MED ORDER — PREDNISONE 50 MG PO TABS: ORAL_TABLET | ORAL | Status: DC

## 2016-01-12 MED ORDER — DIPHENHYDRAMINE HCL 50 MG PO TABS: ORAL_TABLET | ORAL | Status: DC

## 2016-01-12 NOTE — Progress Notes (Signed)
9:51 AM   Lori JanskyYardley Hoston 06/10/1951 [id number]  Referring provider: [name], MD [phone] [address] [place], [place] [address]  Chief Complaint  Patient presents with  . Hematuria    microscopic 1 yr follow up  . Vaginitis    1 yr     HPI: Patient is a 65 year old African-American female with a history of microscopic hematuria and atrophic vaginitis who presents today for 1 year follow-up.  Microscopic hematuria [name] is a 65 year old African-American female who presents today for recheck on her urinalysis to look for microscopic hematuria. She denies any gross hematuria. On her initial visit with us back in December, she was found to have atrophic vaginitis and urethral caruncle. Microscopic hematuria was felt to be coming from this. She was prescribed vaginal estrogen cream which she continues to use.  Her UA contains 3-10 rbc's per high-power field on today's exam.  She is not experiencing gross hematuria.  Atrophic vaginitis Patient continues to use the vaginal estrogen cream 3 nights weekly. She also has a history of a urethral caruncle.  Patient is also experiencing urgency and stress urinary incontinence.  He does wear incontinence pads, but only when she travels.    PMH: Past Medical History  Diagnosis Date  . Vertigo   . Depression   . Hiatal hernia   . Microscopic hematuria   . Sinus headache   . Meniere disease   . TMJ syndrome   . Genital herpes   . Atrophic vaginitis   . Urethral caruncle   . Incomplete bladder emptying   . Back pain     Surgical History: Past Surgical History  Procedure Laterality Date  . Shoulder arthroscopy Left 2002  . Liposuction  2000  . Eye surgery  2000  . Tubal ligation  1983  . Cesarean section  1979/1982    2    Home Medications:    Medication List       This list is accurate as of: 01/12/16  9:51 AM.  Always use your most recent med list.               aspirin 325 MG tablet  Take 325 mg by mouth  daily. Reported on 01/12/2016     azithromycin 250 MG tablet  Commonly known as:  ZITHROMAX Z-PAK  Take as directed on pack     benzonatate 100 MG capsule  Commonly known as:  TESSALON  Take by mouth. Reported on 01/12/2016     cyclobenzaprine 10 MG tablet  Commonly known as:  FLEXERIL  take 1/2 to 1 tablet by mouth every 8 hours if needed     dextromethorphan 30 MG/5ML liquid  Commonly known as:  DELSYM  Take 10 mLs (60 mg total) by mouth 2 (two) times daily.     diphenhydrAMINE 50 MG tablet  Commonly known as:  BENADRYL  Take one hour prior to the CT scan     estradiol 0.1 MG/GM vaginal cream  Commonly known as:  ESTRACE  Place 1 Applicatorful vaginally at bedtime. Patient was given a sample of vaginal estrogen cream and instructed to apply 0.5mg  (pea-sized amount)  just inside the vaginal introitus with a finger-tip every night for two weeks and then Monday, Wednesday and Friday nights.     HYDROcodone-acetaminophen 10-325 MG tablet  Commonly known as:  NORCO  Reported on 01/12/2016     ipratropium 0.06 % nasal spray  Commonly known as:  ATROVENT  Place 2 sprays into the  nose 4 (four) times daily.     meclizine 25 MG tablet  Commonly known as:  ANTIVERT  Take by mouth.     naproxen sodium 220 MG tablet  Commonly known as:  ANAPROX  Take 440 mg by mouth daily as needed. Reported on 01/12/2016     nortriptyline 10 MG capsule  Commonly known as:  PAMELOR  Reported on 01/12/2016     ondansetron 4 MG tablet  Commonly known as:  ZOFRAN  Take 1 tablet (4 mg total) by mouth every 6 (six) hours.     oxyCODONE-acetaminophen 5-325 MG tablet  Commonly known as:  PERCOCET/ROXICET  Take 1 tablet by mouth every 6 (six) hours as needed for pain.     predniSONE 50 MG tablet  Commonly known as:  DELTASONE  Take the one tablet 13 hours, 7 hours and 1 hours prior to the CT Urogram     PROAIR HFA 108 (90 Base) MCG/ACT inhaler  Generic drug:  albuterol  Inhale into the lungs.       ranitidine 150 MG tablet  Commonly known as:  ZANTAC  Take one tablet 1 hour prior to CT Urogram     sulfamethoxazole-trimethoprim 800-160 MG per tablet  Commonly known as:  BACTRIM DS  Take 1 tablet by mouth 2 (two) times daily. Reported on 01/12/2016     SYSTANE ULTRA 0.4-0.3 % Soln  Generic drug:  Polyethyl Glycol-Propyl Glycol  Place 2 drops into the right eye daily as needed. Reported on 01/12/2016     traMADol 50 MG tablet  Commonly known as:  ULTRAM  Reported on 01/12/2016     VITAMIN E PO  Take 1 capsule by mouth daily. Reported on 01/12/2016        Allergies:  Allergies  Allergen Reactions  . Iodinated Diagnostic Agents Anaphylaxis  . Shellfish Allergy Anaphylaxis  . Ibuprofen Other (See Comments)    GI symptoms  . Strawberry Extract Hives    Family History: Family History  Problem Relation Age of Onset  . Diabetes Mother   . Cancer Father     Pancreatic  . Cancer Sister   . Diabetes Brother   . Hematuria Mother   . Uterine cancer Mother   . Bladder Cancer Mother   . Kidney disease Neg Hx     Social History:  reports that she has never smoked. She does not have any smokeless tobacco history on file. She reports that she drinks alcohol. She reports that she does not use illicit drugs.  ROS: Urological Symptom Review  Patient is experiencing the following symptoms: Blood in urine   Review of Systems  Gastrointestinal (upper)  : Negative for upper GI symptoms  Gastrointestinal (lower) : Constipation  Constitutional : Negative for symptoms  Skin: Negative for skin symptoms  Eyes: Negative for eye symptoms  Ear/Nose/Throat : Negative for Ear/Nose/Throat symptoms  Hematologic/Lymphatic: Negative for Hematologic/Lymphatic symptoms  Cardiovascular : Negative for cardiovascular symptoms  Respiratory : Negative for respiratory symptoms  Endocrine: Negative for endocrine symptoms  Musculoskeletal: Negative for musculoskeletal  symptoms  Neurological: Negative for neurological symptoms  Psychologic: Negative for psychiatric symptoms   Physical Exam: BP 108/71 mmHg  Pulse 80  Ht  (1.6 m)  Wt 164 lb 3.2 oz (74.481 kg)  BMI 29.09 kg/m2  Constitutional: Well nourished. Alert and oriented, No acute distress. HEENT: [place] AT, moist mucus membranes. Trachea midline, no masses. Cardiovascular: No clubbing, cyanosis, or edema. Respiratory: Normal respiratory effort, no increased work of  breathing. GI: Abdomen is soft, non tender, non distended, no abdominal masses. Liver and spleen not palpable.  No hernias appreciated.  Stool sample for occult testing is not indicated.   GU: No CVA tenderness.  No bladder fullness or masses.  Atrophic external genitalia, normal pubic hair distribution, no lesions.  Normal urethral meatus, no lesions, no prolapse, no discharge.  Urethral caruncle is noted.  It is not friable.   No bladder fullness, tenderness or masses. Normal vagina mucosa, good estrogen effect, no discharge, no lesions, good pelvic support, Grade II cystocele is noted and no rectocele noted.  No cervical motion tenderness.  Uterus is freely mobile and non-fixed.  No adnexal/parametria masses or tenderness noted.  Anus and perineum are without rashes or lesions.    Skin: No rashes, bruises or suspicious lesions. Lymph: No cervical or inguinal adenopathy. Neurologic: Grossly intact, no focal deficits, moving all 4 extremities. Psychiatric: Normal mood and affect.  Laboratory Data:  Lab Results  Component Value Date   WBC 8.4 07/08/2014   HGB 13.0 07/08/2014   HCT 39.7 07/08/2014   MCV 87 07/08/2014   PLT 347 07/08/2014    Lab Results  Component Value Date   CREATININE 0.86 07/08/2014    Urinalysis Results for orders placed or performed in visit on 01/12/16  Microscopic Examination  Result Value Ref Range   WBC, UA 11-30 (A) 0 -  5 /hpf   RBC, UA 3-10 (A) 0 -  2 /hpf   Epithelial Cells (non renal)  >10 (A) 0 - 10 /hpf   Renal Epithel, UA 0-10 (A) None seen /hpf   Bacteria, UA Few None seen/Few  Urinalysis, Complete  Result Value Ref Range   Specific Gravity, UA 1.020 1.005 - 1.030   pH, UA 5.5 5.0 - 7.5   Color, UA Yellow Yellow   Appearance Ur Cloudy (A) Clear   Leukocytes, UA 2+ (A) Negative   Protein, UA Negative Negative/Trace   Glucose, UA Negative Negative   Ketones, UA Negative Negative   RBC, UA 1+ (A) Negative   Bilirubin, UA Negative Negative   Urobilinogen, Ur 0.2 0.2 - 1.0 mg/dL   Nitrite, UA Negative Negative   Microscopic Examination See below:   BUN+Creat  Result Value Ref Range   BUN 13 8 - 27 mg/dL   Creatinine, Ser 1.610.87 0.57 - 1.00 mg/dL   GFR calc non Af Amer 71 >59 mL/min/1.73   GFR calc Af Amer 81 >59 mL/min/1.73   BUN/Creatinine Ratio 15 12 - 28     Assessment & Plan:    1. Microscopic hematuria:   I explained to the patient that there are a number of causes that can be associated with blood in the urine, such as stones, UTI's, damage to the urinary tract and/or cancer.  At this time, I felt that the patient warranted further urologic evaluation.   The AUA guidelines state that a CT urogram is the preferred imaging study to evaluate hematuria.  I explained to the patient that a contrast material will be injected into a vein and that in rare instances, an allergic reaction can result and may even life threatening   The patient has allergies to contrast and seafood.  Allergy prep is given.    Her reproductive status is status post menopausal, tubal ligation.    Following the imaging study,  I've recommended a cystoscopy. I described how this is performed, typically in an office setting with a flexible cystoscope. We described the risks,  benefits, and possible side effects, the most common of which is a minor amount of blood in the urine and/or burning which usually resolves in 24 to 48 hours.    The patient had the opportunity to ask questions which  were answered. Based upon this discussion, the patient is willing to proceed. Therefore, I've ordered: a CT Urogram and cystoscopy.  She will return following all of the above for discussion of the results.     - Urinalysis, Complete  2. Atrophic vaginitis:   Continue the estrogen vaginal cream three nights weekly.  3. Urethral caruncle:   She will now apply the estrogen vaginal cream Monday nights, Wednesday nights and Friday nights.    Return for CT Urogram report and cystoscopy.  Michiel Cowboy, PA-C  [facility] [address] [place], [place] [address] [phone]

## 2016-01-13 LAB — BUN+CREAT
BUN/Creatinine Ratio: 15 (ref 12–28)
BUN: 13 mg/dL (ref 8–27)
CREATININE: 0.87 mg/dL (ref 0.57–1.00)
GFR calc Af Amer: 81 mL/min/{1.73_m2} (ref >59)
GFR calc non Af Amer: 71 mL/min/{1.73_m2} (ref >59)

## 2016-01-15 ENCOUNTER — Ambulatory Visit
Admission: RE | Admit: 2016-01-15 | Discharge: 2016-01-15 | Disposition: A | Discharge status: Home / Self Care | Payer: BC Managed Care – PPO | Source: Ambulatory Visit | Attending: Urology | Admitting: Urology

## 2016-01-15 DIAGNOSIS — R3129 Other microscopic hematuria: Secondary | ICD-10-CM

## 2016-01-15 MED ORDER — IOPAMIDOL (ISOVUE-370) INJECTION 76%
100.0000 mL | Freq: Once | INTRAVENOUS | Status: AC | PRN
  Administered 2016-01-15: 100 mL via INTRAVENOUS

## 2016-02-23 ENCOUNTER — Other Ambulatory Visit: Payer: Self-pay | Admitting: Neurology

## 2016-02-23 DIAGNOSIS — F0781 Postconcussional syndrome: Secondary | ICD-10-CM

## 2016-02-23 DIAGNOSIS — R42 Dizziness and giddiness: Secondary | ICD-10-CM

## 2016-02-24 ENCOUNTER — Other Ambulatory Visit: Payer: BC Managed Care – PPO | Admitting: Urology

## 2016-02-24 ENCOUNTER — Ambulatory Visit
Admission: RE | Admit: 2016-02-24 | Discharge: 2016-02-24 | Disposition: A | Discharge status: Home / Self Care | Payer: BC Managed Care – PPO | Source: Ambulatory Visit | Attending: Neurology | Admitting: Neurology

## 2016-02-24 DIAGNOSIS — F0781 Postconcussional syndrome: Secondary | ICD-10-CM | POA: Insufficient documentation

## 2016-02-24 DIAGNOSIS — R42 Dizziness and giddiness: Secondary | ICD-10-CM | POA: Diagnosis not present

## 2016-03-25 ENCOUNTER — Ambulatory Visit (INDEPENDENT_AMBULATORY_CARE_PROVIDER_SITE_OTHER): Payer: BC Managed Care – PPO | Admitting: Urology

## 2016-03-25 VITALS — BP 112/61 | HR 94 | Ht 63.0 in | Wt 169.0 lb

## 2016-03-25 DIAGNOSIS — N952 Postmenopausal atrophic vaginitis: Secondary | ICD-10-CM

## 2016-03-25 DIAGNOSIS — R3129 Other microscopic hematuria: Secondary | ICD-10-CM | POA: Diagnosis not present

## 2016-03-25 LAB — URINALYSIS, COMPLETE
BILIRUBIN UA: NEGATIVE
GLUCOSE, UA: NEGATIVE
KETONES UA: NEGATIVE
LEUKOCYTES UA: NEGATIVE
NITRITE UA: NEGATIVE
Protein, UA: NEGATIVE
SPEC GRAV UA: 1.02 (ref 1.005–1.030)
Urobilinogen, Ur: 0.2 mg/dL (ref 0.2–1.0)
pH, UA: 5.5 (ref 5.0–7.5)

## 2016-03-25 LAB — MICROSCOPIC EXAMINATION: Bacteria, UA: NONE SEEN

## 2016-03-25 MED ORDER — LIDOCAINE HCL 2 % EX GEL
1.0000 "application " | Freq: Once | CUTANEOUS | Status: AC
  Administered 2016-03-25: 1 via URETHRAL

## 2016-03-25 MED ORDER — CIPROFLOXACIN HCL 500 MG PO TABS
500.0000 mg | ORAL_TABLET | Freq: Once | ORAL | Status: AC
  Administered 2016-03-25: 500 mg via ORAL

## 2016-03-25 NOTE — Progress Notes (Signed)
03/25/2016 9:41 AM   Lori Maddox 12/17/1950 [id number]  Referring provider: [name], MD [phone] [address] [place], [place] [id number]  Chief Complaint  Patient presents with  . Cysto    HPI: 65 year old female who presents today for completion of her microscopic hematuria workup. She underwent CT urogram which showed no evidence of GU pathology. She returns today to the office for cystoscopy.   She is a nonsmoker. No history of gross hematuria.  She does have a history of atrophic vaginitis that it recently started using vaginal estrogen cream.      PMH: Past Medical History:  Diagnosis Date  . Atrophic vaginitis   . Back pain   . Depression   . Genital herpes   . Hiatal hernia   . Incomplete bladder emptying   . Meniere disease   . Microscopic hematuria   . Sinus headache   . TMJ syndrome   . Urethral caruncle   . Vertigo     Surgical History: Past Surgical History:  Procedure Laterality Date  . CESAREAN SECTION  1979/1982   2  . EYE SURGERY  2000  . Liposuction  2000  . SHOULDER ARTHROSCOPY Left 2002  . TUBAL LIGATION  1983    Home Medications:    Medication List       Accurate as of 03/25/16  9:41 AM. Always use your most recent med list.          aspirin 325 MG tablet Take 325 mg by mouth daily. Reported on 01/12/2016   estradiol 0.1 MG/GM vaginal cream Commonly known as:  ESTRACE Place 1 Applicatorful vaginally at bedtime. Patient was given a sample of vaginal estrogen cream and instructed to apply 0.5mg  (pea-sized amount)  just inside the vaginal introitus with a finger-tip every night for two weeks and then Monday, Wednesday and Friday nights.   meclizine 25 MG tablet Commonly known as:  ANTIVERT Take by mouth.   nortriptyline 10 MG capsule Commonly known as:  PAMELOR Reported on 01/12/2016   PROAIR HFA 108 (90 Base) MCG/ACT inhaler Generic drug:  albuterol Inhale into the lungs.       Allergies:  Allergies  Allergen  Reactions  . Iodinated Diagnostic Agents Anaphylaxis  . Shellfish Allergy Anaphylaxis  . Ibuprofen Other (See Comments)    GI symptoms  . Strawberry Extract Hives    Family History: Family History  Problem Relation Age of Onset  . Diabetes Mother   . Cancer Father     Pancreatic  . Cancer Sister   . Diabetes Brother   . Hematuria Mother   . Uterine cancer Mother   . Bladder Cancer Mother   . Kidney disease Neg Hx     Social History:  reports that she has never smoked. She does not have any smokeless tobacco history on file. She reports that she drinks alcohol. She reports that she does not use drugs.   Physical Exam: BP 112/61   Pulse 94   Ht 5\' 3"  (1.6 m)   Wt 169 lb (76.7 kg)   BMI 29.94 kg/m   Constitutional:  Alert and oriented, No acute distress. HEENT: [place] AT, moist mucus membranes.  Trachea midline, no masses. Cardiovascular: No clubbing, cyanosis, or edema. Respiratory: Normal respiratory effort, no increased work of breathing. GI: Abdomen is soft, nontender, nondistended, no abdominal masses GU: Normal external genitalia. Vitiligo noted involving labia. Normal urethral meatus. Atrophic vaginitis appreciated. Skin: No rashes, bruises or suspicious lesions. Neurologic: Grossly intact, no focal  deficits, moving all 4 extremities. Psychiatric: Normal mood and affect.  Laboratory Data: Lab Results  Component Value Date   WBC 8.4 07/08/2014   HGB 13.0 07/08/2014   HCT 39.7 07/08/2014   MCV 87 07/08/2014   PLT 347 07/08/2014    Lab Results  Component Value Date   CREATININE 0.87 01/12/2016    Urinalysis UA reviewed today  Pertinent Imaging: Study Result   CLINICAL DATA:  Patient with microscopic hematuria for 1 year. Left lower quadrant pain.  EXAM: CT ABDOMEN AND PELVIS WITHOUT AND WITH CONTRAST  TECHNIQUE: Multidetector CT imaging of the abdomen and pelvis was performed following the standard protocol before and following the  bolus administration of intravenous contrast.  CONTRAST:  100 cc Isovue 370  COMPARISON:  CT abdomen pelvis 07/08/2014  FINDINGS: Lower chest: Small amount pericardial fluid. Heart is normal in size. Lung bases are clear.  Hepatobiliary: Liver is normal in size and contour. No focal hepatic lesions identified. Gallbladder is unremarkable. No intrahepatic or extrahepatic biliary ductal dilatation.  Pancreas: Unremarkable  Spleen: Unremarkable  Adrenals/Urinary Tract: The adrenal glands are normal. No nephroureterolithiasis. No hydronephrosis. Kidneys enhance symmetrically with contrast. No suspicious enhancing renal masses are identified. Delayed images demonstrate excretion of contrast into the bilateral renal collecting systems, ureters and bladder. No abnormal filling defects are identified.  Stomach/Bowel: No abnormal bowel wall thickening or evidence for bowel obstruction.  Vascular/Lymphatic: Normal caliber abdominal aorta. No retroperitoneal lymphadenopathy.  Other: Uterus and adnexal structures are unremarkable.  Musculoskeletal: No aggressive or acute appearing osseous lesions.  IMPRESSION: No nephroureterolithiasis. No hydronephrosis. No suspicious enhancing renal masses. No abnormal filling defects identified within the bilateral ureters.   Electronically Signed   By: Annia Belt M.D.   On: 01/15/2016 12:55    CT scan personally reviewed   Cystoscopy Procedure Note  Patient identification was confirmed, informed consent was obtained, and patient was prepped using Betadine solution.  Lidocaine jelly was administered per urethral meatus.    Preoperative abx where received prior to procedure.    Procedure: - Flexible cystoscope introduced, without any difficulty.   - Thorough search of the bladder revealed:    normal urethral meatus    normal urothelium    no stones    no ulcers     no tumors    no urethral polyps    no  trabeculation  - Ureteral orifices were normal in position and appearance.  Post-Procedure: - Patient tolerated the procedure well  Assessment & Plan:   1. Microscopic hematuria S/p negative work up including CT urogram and cystoscopy F/u in ~3 years if hematuria persists - Urinalysis, Complete - ciprofloxacin (CIPRO) tablet 500 mg; Take 1 tablet (500 mg total) by mouth once. - lidocaine (XYLOCAINE) 2 % jelly 1 application; Place 1 application into the urethra once.  2. Atrophic vaginitis Continue vaginal estrogen cream  Return if symptoms worsen or fail to improve.  Vanna Scotland, MD  [facility] [address], [address] [place], [place] [address] [phone]

## 2016-05-31 ENCOUNTER — Encounter: Payer: Self-pay | Admitting: Urology

## 2016-05-31 ENCOUNTER — Ambulatory Visit (INDEPENDENT_AMBULATORY_CARE_PROVIDER_SITE_OTHER): Payer: BC Managed Care – PPO | Admitting: Urology

## 2016-05-31 VITALS — BP 114/73 | HR 89 | Ht 63.0 in | Wt 173.4 lb

## 2016-05-31 DIAGNOSIS — N952 Postmenopausal atrophic vaginitis: Secondary | ICD-10-CM | POA: Diagnosis not present

## 2016-05-31 DIAGNOSIS — L80 Vitiligo: Secondary | ICD-10-CM | POA: Diagnosis not present

## 2016-05-31 DIAGNOSIS — N362 Urethral caruncle: Secondary | ICD-10-CM | POA: Diagnosis not present

## 2016-05-31 DIAGNOSIS — R3129 Other microscopic hematuria: Secondary | ICD-10-CM | POA: Diagnosis not present

## 2016-05-31 NOTE — Progress Notes (Signed)
3:09 PM   Lori JanskyYardley Lim 06/07/1951 [id number]  Referring provider: [name], MD [phone] [address] [place], [place] [address]  Chief Complaint  Patient presents with  . Vaginal Atrophy    patient is having vaginal tissue discolorization wants to know if its the vaginal cream doing it    HPI: Patient is a 65 year old African-American female with a history of microscopic hematuria and atrophic vaginitis who presents today for discoloration age and in her vaginal area.    Microscopic hematuria Patient completed a hematuria workup in September 2017 with CT urogram and cystoscopy. No evidence of GU pathology was found.   She is not experiencing gross hematuria.  Atrophic vaginitis Patient continues to use the vaginal estrogen cream 3 nights weekly. She also has a history of a urethral caruncle.  Patient is also experiencing urgency and stress urinary incontinence.  She does wear incontinence pads, but only when she travels.  Last month, the patient noted an area of discoloration within her vaginal area. She is sexually monogamous. She has not had a vaginal discharge. She is not using any other vaginal creams or douching. She is not experiencing vaginal itching or pain.  Not noted any discoloration on other parts of her skin.      PMH: Past Medical History:  Diagnosis Date  . Atrophic vaginitis   . Back pain   . Depression   . Genital herpes   . Hiatal hernia   . Incomplete bladder emptying   . Meniere disease   . Microscopic hematuria   . Sinus headache   . TMJ syndrome   . Urethral caruncle   . Vertigo     Surgical History: Past Surgical History:  Procedure Laterality Date  . CESAREAN SECTION  1979/1982   2  . EYE SURGERY  2000  . Liposuction  2000  . SHOULDER ARTHROSCOPY Left 2002  . TUBAL LIGATION  1983    Home Medications:    Medication List       Accurate as of 05/31/16  3:09 PM. Always use your most recent med list.          aspirin 325 MG  tablet Take 325 mg by mouth daily. Reported on 01/12/2016   estradiol 0.1 MG/GM vaginal cream Commonly known as:  ESTRACE Place 1 Applicatorful vaginally at bedtime. Patient was given a sample of vaginal estrogen cream and instructed to apply 0.5mg  (pea-sized amount)  just inside the vaginal introitus with a finger-tip every night for two weeks and then Monday, Wednesday and Friday nights.   gabapentin 300 MG capsule Commonly known as:  NEURONTIN Take 300 mg by mouth 3 (three) times daily.   meclizine 25 MG tablet Commonly known as:  ANTIVERT Take by mouth.   nortriptyline 10 MG capsule Commonly known as:  PAMELOR Reported on 01/12/2016   PROAIR HFA 108 (90 Base) MCG/ACT inhaler Generic drug:  albuterol Inhale into the lungs.       Allergies:  Allergies  Allergen Reactions  . Iodinated Diagnostic Agents Anaphylaxis  . Shellfish Allergy Anaphylaxis  . Ibuprofen Other (See Comments)    GI symptoms  . Strawberry Extract Hives    Family History: Family History  Problem Relation Age of Onset  . Diabetes Mother   . Cancer Father     Pancreatic  . Cancer Sister   . Diabetes Brother   . Hematuria Mother   . Uterine cancer Mother   . Bladder Cancer Mother   . Kidney disease Neg  Hx     Social History:  reports that she has never smoked. She has never used smokeless tobacco. She reports that she drinks alcohol. She reports that she does not use drugs.  ROS UROLOGY Frequent Urination?: Yes Hard to postpone urination?: No Burning/pain with urination?: No Get up at night to urinate?: No Leakage of urine?: Yes Urine stream starts and stops?: No Trouble starting stream?: No Do you have to strain to urinate?: No Blood in urine?: No Urinary tract infection?: No Sexually transmitted disease?: No Injury to kidneys or bladder?: No Painful intercourse?: No Weak stream?: No Currently pregnant?: No Vaginal bleeding?: No Last menstrual period?: n Gastrointestinal Nausea?:  No Vomiting?: No Indigestion/heartburn?: No Diarrhea?: No Constipation?: Yes Constitutional Fever: No Night sweats?: No Weight loss?: No Fatigue?: No Skin Skin rash/lesions?: No Itching?: No Eyes Blurred vision?: No Double vision?: No Ears/Nose/Throat Sore throat?: No Sinus problems?: No Hematologic/Lymphatic Swollen glands?: No Easy bruising?: No Cardiovascular Leg swelling?: No Chest pain?: No Respiratory Cough?: No Shortness of breath?: No Endocrine Excessive thirst?: No Musculoskeletal Back pain?: No Joint pain?: No Neurological Headaches?: Yes Dizziness?: Yes Psychologic Depression?: No Anxiety?: No    Physical Exam: BP 114/73 (BP Location: Left Arm, Patient Position: Sitting, Cuff Size: Normal)   Pulse 89   Ht 5\' 3"  (1.6 m)   Wt 173 lb 6.4 oz (78.7 kg)   BMI 30.72 kg/m   Constitutional: Well nourished. Alert and oriented, No acute distress. HEENT: [place] AT, moist mucus membranes. Trachea midline, no masses. Cardiovascular: No clubbing, cyanosis, or edema. Respiratory: Normal respiratory effort, no increased work of breathing. GI: Abdomen is soft, non tender, non distended, no abdominal masses. Liver and spleen not palpable.  No hernias appreciated.  Stool sample for occult testing is not indicated.   GU: No CVA tenderness.  No bladder fullness or masses.  Patches of skin on the labia that are white with a sharp margins are seen,  atrophic external genitalia, normal pubic hair distribution, no lesions.  Normal urethral meatus, no lesions, no prolapse, no discharge.  Urethral caruncle is noted.  It is not friable.   No bladder fullness, tenderness or masses. Normal vagina mucosa, good estrogen effect, no discharge, no lesions, good pelvic support, Grade II cystocele is noted and no rectocele noted.  No cervical motion tenderness.  Uterus is freely mobile and non-fixed.  No adnexal/parametria masses or tenderness noted.  Anus and perineum are without rashes or  lesions.    Skin: No rashes, bruises or suspicious lesions. Lymph: No cervical or inguinal adenopathy. Neurologic: Grossly intact, no focal deficits, moving all 4 extremities. Psychiatric: Normal mood and affect.  Laboratory Data:   Lab Results  Component Value Date   CREATININE 0.87 01/12/2016   Assessment & Plan:    1. Vitiligo  - Explained to the patient that this is the condition where parts of the skin lose their epidermal melanocytes causing the loss of pigmentation in these areas  - Explained to the patient that this condition is felt to have an autoimmune etiology  - It can be associated with thyroid disorders, so we will check a TSH  - Offered her a referral to dermatology-explained to the patient that there was no cure were for the depigmentation but treatments may lessen the severity of pigment loss  2. Microscopic hematuria  - S/p negative work up including CT urogram and cystoscopy in 03/2016  - F/u in ~3 years if hematuria persists  3. Atrophic vaginitis:   Continue the  estrogen vaginal cream three nights weekly.  4. Urethral caruncle:   She will now apply the estrogen vaginal cream Monday nights, Wednesday nights and Friday nights.    Return for pending labs.  Michiel Cowboy, PA-C  [facility] [address], [address] [place], [place] [address] [phone]

## 2016-06-01 ENCOUNTER — Telehealth: Payer: Self-pay

## 2016-06-01 LAB — T4 AND TSH
T4, Total: 5 ug/dL (ref 4.5–12.0)
TSH: 1.72 u[IU]/mL (ref 0.450–4.500)

## 2016-06-01 NOTE — Telephone Encounter (Signed)
-----   Message from Harle BattiestShannon A McGowan, PA-C sent at 06/01/2016  8:42 AM EST ----- Patient's thyroid tests are normal.  Would see like me to refer her to a dermatologist?

## 2016-06-01 NOTE — Telephone Encounter (Signed)
Attempted to contact the pt, no answer. LMOM. 

## 2016-06-02 ENCOUNTER — Other Ambulatory Visit: Payer: Self-pay | Admitting: Urology

## 2016-06-02 DIAGNOSIS — L80 Vitiligo: Secondary | ICD-10-CM

## 2016-06-02 NOTE — Telephone Encounter (Signed)
The pt was notified about her lab results. She would like to be referred to the dermatologist.

## 2016-06-06 NOTE — Telephone Encounter (Signed)
Does she have a dermatologist in mind?  [place] have great availability.

## 2016-06-24 ENCOUNTER — Telehealth: Payer: Self-pay

## 2016-06-24 DIAGNOSIS — L80 Vitiligo: Secondary | ICD-10-CM

## 2016-06-24 NOTE — Telephone Encounter (Signed)
Open in error

## 2016-11-01 ENCOUNTER — Other Ambulatory Visit: Payer: Self-pay | Admitting: Neurology

## 2016-11-01 DIAGNOSIS — R410 Disorientation, unspecified: Secondary | ICD-10-CM

## 2016-11-01 DIAGNOSIS — R42 Dizziness and giddiness: Secondary | ICD-10-CM

## 2016-11-01 DIAGNOSIS — G44329 Chronic post-traumatic headache, not intractable: Secondary | ICD-10-CM

## 2016-11-01 DIAGNOSIS — F0781 Postconcussional syndrome: Secondary | ICD-10-CM

## 2016-11-10 ENCOUNTER — Ambulatory Visit: Admission: RE | Admit: 2016-11-10 | Payer: Medicare Other | Source: Ambulatory Visit

## 2016-11-10 ENCOUNTER — Ambulatory Visit
Admission: RE | Admit: 2016-11-10 | Discharge: 2016-11-10 | Disposition: A | Discharge status: Home / Self Care | Payer: Medicare Other | Source: Ambulatory Visit | Attending: Neurology | Admitting: Neurology

## 2016-11-10 ENCOUNTER — Ambulatory Visit: Payer: Medicare Other

## 2016-11-10 DIAGNOSIS — R42 Dizziness and giddiness: Secondary | ICD-10-CM | POA: Insufficient documentation

## 2016-11-10 DIAGNOSIS — R9082 White matter disease, unspecified: Secondary | ICD-10-CM | POA: Diagnosis not present

## 2016-11-10 DIAGNOSIS — R410 Disorientation, unspecified: Secondary | ICD-10-CM | POA: Diagnosis present

## 2016-11-10 DIAGNOSIS — F0781 Postconcussional syndrome: Secondary | ICD-10-CM | POA: Insufficient documentation

## 2016-11-10 DIAGNOSIS — G44329 Chronic post-traumatic headache, not intractable: Secondary | ICD-10-CM | POA: Insufficient documentation

## 2016-11-21 ENCOUNTER — Ambulatory Visit: Payer: Medicare Other | Attending: Neurology | Admitting: Speech Pathology

## 2016-11-21 DIAGNOSIS — R41841 Cognitive communication deficit: Secondary | ICD-10-CM | POA: Diagnosis present

## 2016-11-22 ENCOUNTER — Encounter: Payer: Self-pay | Admitting: Speech Pathology
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
## 2016-11-28 ENCOUNTER — Encounter: Payer: Medicare Other | Admitting: Speech Pathology

## 2016-11-28 ENCOUNTER — Encounter: Payer: Self-pay | Admitting: Speech Pathology

## 2016-11-28 ENCOUNTER — Ambulatory Visit: Payer: Medicare Other | Admitting: Speech Pathology

## 2016-11-28 DIAGNOSIS — R41841 Cognitive communication deficit: Secondary | ICD-10-CM | POA: Diagnosis not present
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
## 2016-12-02 ENCOUNTER — Encounter: Payer: Medicare Other | Admitting: Speech Pathology

## 2016-12-05 ENCOUNTER — Encounter: Payer: Medicare Other | Admitting: Speech Pathology

## 2016-12-06 ENCOUNTER — Ambulatory Visit: Payer: Medicare Other | Admitting: Speech Pathology

## 2016-12-06 ENCOUNTER — Encounter: Payer: Self-pay | Admitting: Speech Pathology

## 2016-12-06 DIAGNOSIS — R41841 Cognitive communication deficit: Secondary | ICD-10-CM | POA: Diagnosis not present
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
## 2016-12-07 ENCOUNTER — Encounter: Payer: Medicare Other | Admitting: Speech Pathology

## 2016-12-14 ENCOUNTER — Encounter: Payer: Self-pay | Admitting: Speech Pathology

## 2016-12-19 ENCOUNTER — Encounter: Payer: Self-pay | Admitting: Speech Pathology

## 2016-12-19 ENCOUNTER — Ambulatory Visit: Payer: Medicare Other | Attending: Neurology | Admitting: Speech Pathology

## 2016-12-19 DIAGNOSIS — R41841 Cognitive communication deficit: Secondary | ICD-10-CM
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
## 2016-12-21 ENCOUNTER — Encounter: Payer: Medicare Other | Admitting: Speech Pathology

## 2016-12-26 ENCOUNTER — Encounter: Payer: Self-pay | Admitting: Speech Pathology

## 2016-12-28 ENCOUNTER — Encounter: Payer: Self-pay | Admitting: Speech Pathology

## 2017-01-02 ENCOUNTER — Encounter: Payer: Self-pay | Admitting: Speech Pathology

## 2017-01-04 ENCOUNTER — Encounter: Payer: Self-pay | Admitting: Speech Pathology

## 2017-01-22 IMAGING — CT CT HEAD W/O CM
3 of 4 series · 14 of 30 positions shown, 16 images · non-contrast
Comparison: 05/19/2009

CLINICAL DATA: Fall in garage 2 weeks ago. Persistent head and neck
pain. Initial encounter.

EXAM:
CT HEAD WITHOUT CONTRAST
CT CERVICAL SPINE WITHOUT CONTRAST
TECHNIQUE: Multidetector CT imaging of the head and cervical spine was
performed following the standard protocol without intravenous
contrast. Multiplanar CT image reconstructions of the cervical spine
were also generated.

[Series 2: soft tissue · axial · 0.42mm/px · z∈[+368,+413]mm · 2 of 29 slices shown]
[im 10/29  brain]
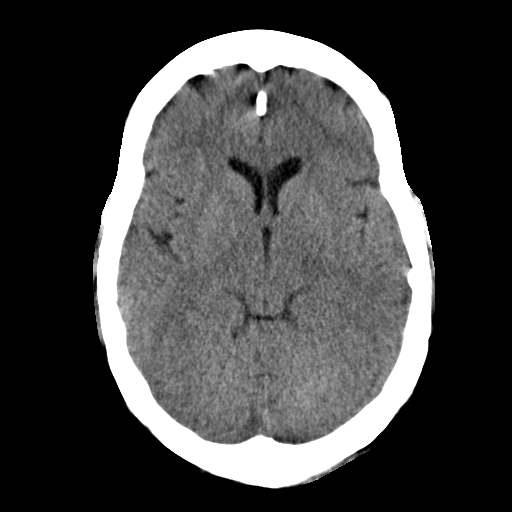
[im 19/29  brain]
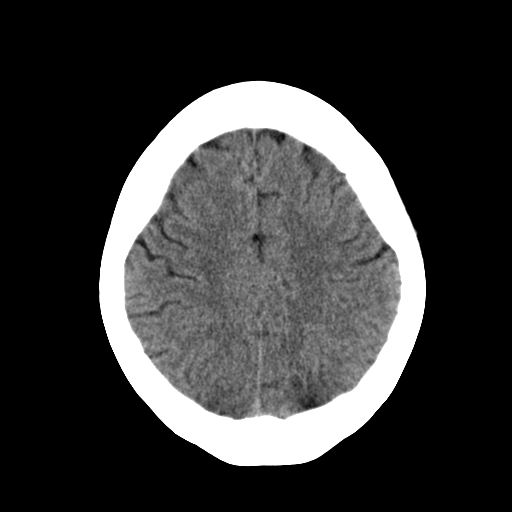

[Series 3: bone · axial · 0.42mm/px · z∈[+333,+413]mm · 5 of 82 slices shown]
[im 11/82  bone]
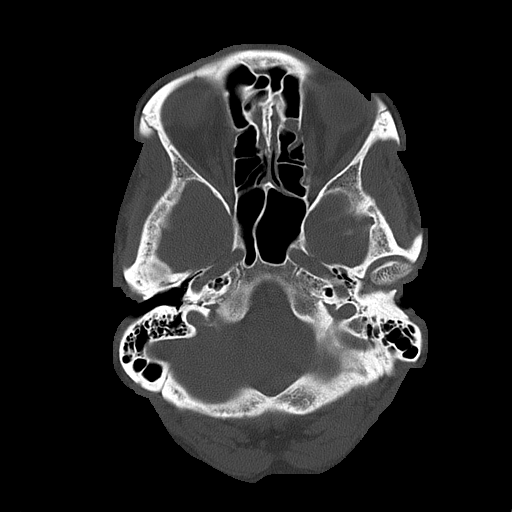
[im 21/82  bone]
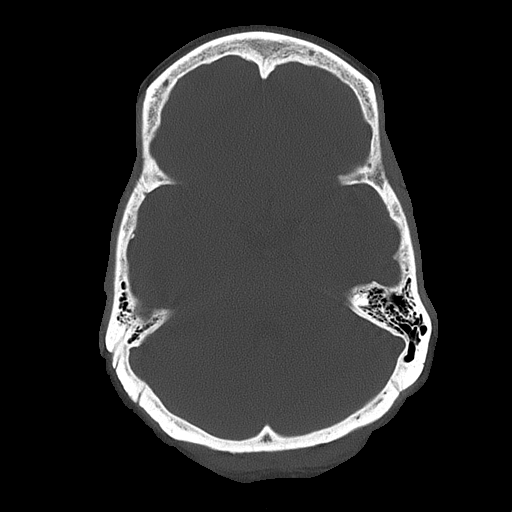
[im 31/82  bone]
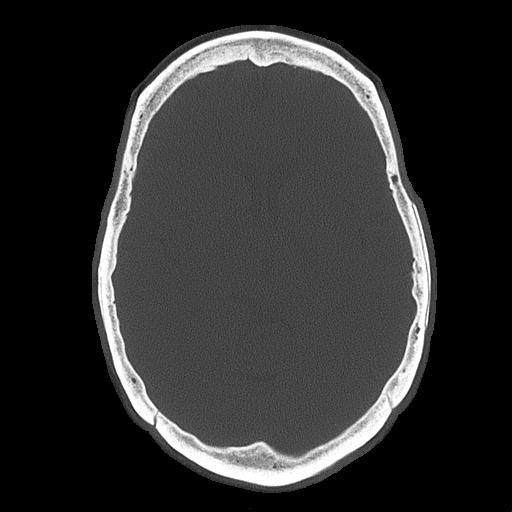
[im 41/82  bone]
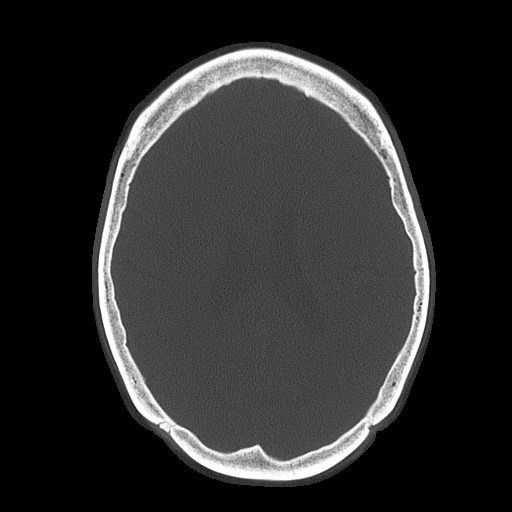
[im 51/82  bone]
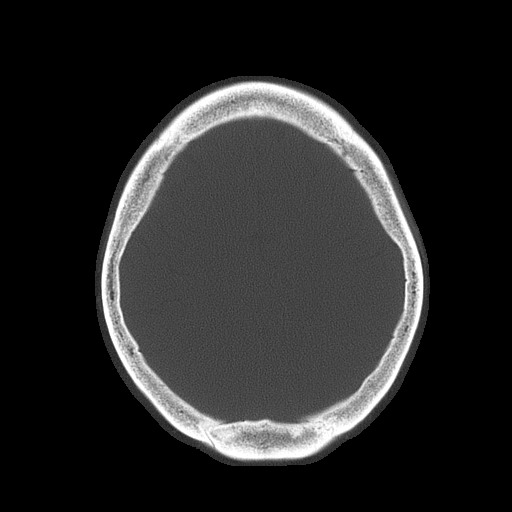

[Series 10: orthogonal axials · axial · 0.19mm/px · z∈[+205,+303]mm · 7 of 77 slices shown, 9 images]
[im 10/77  brain]
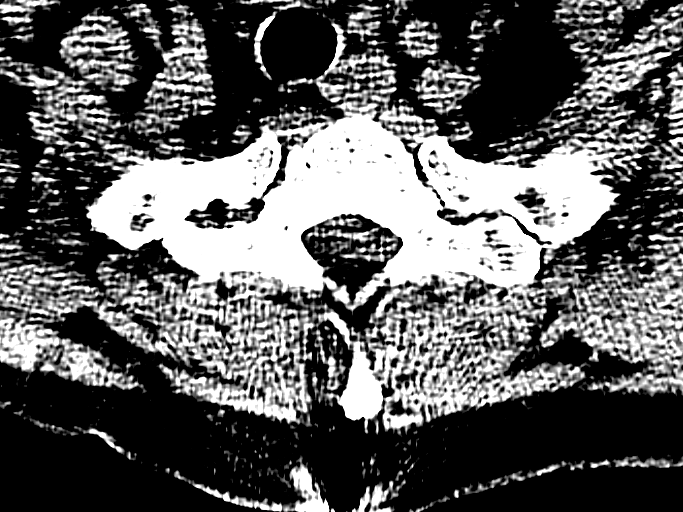
[im 10/77  bone]
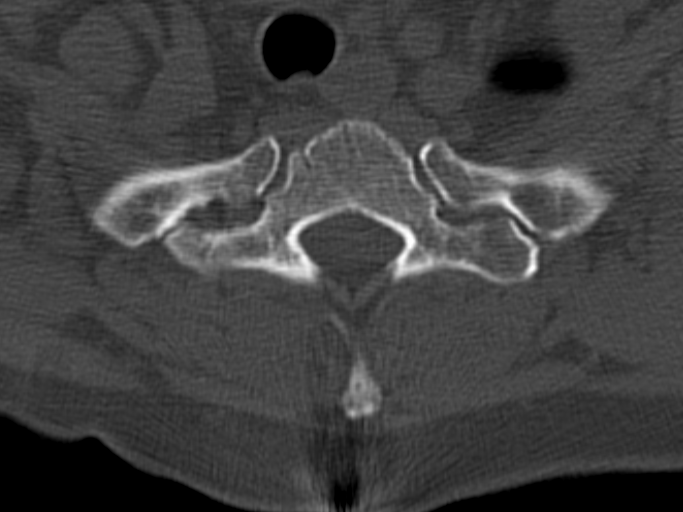
[im 20/77  brain]
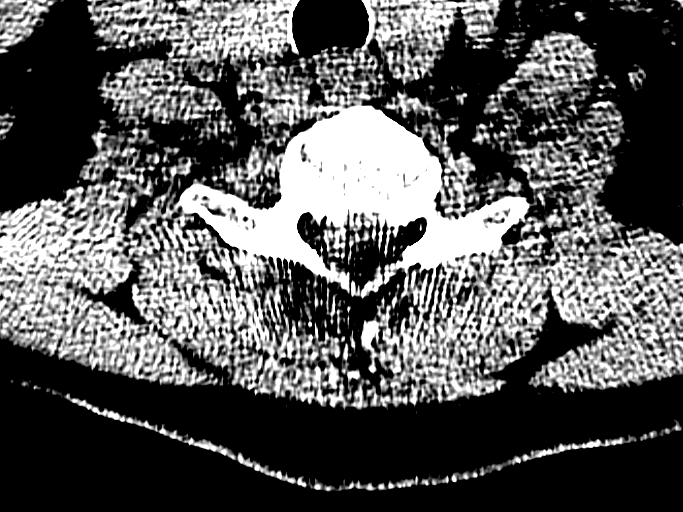
[im 29/77  brain]
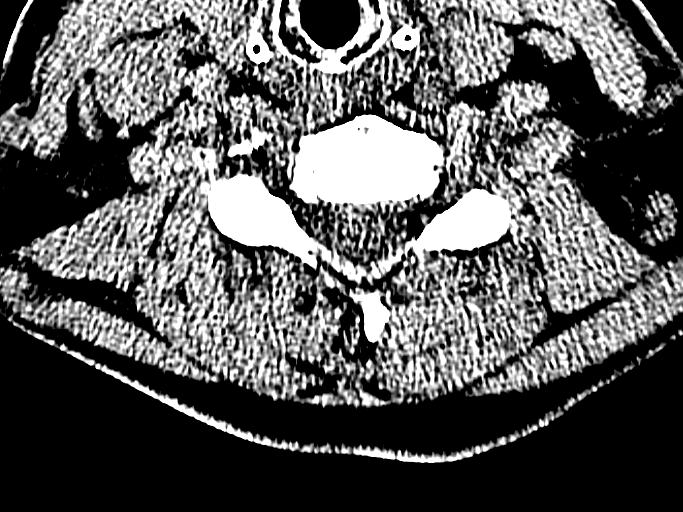
[im 39/77  brain]
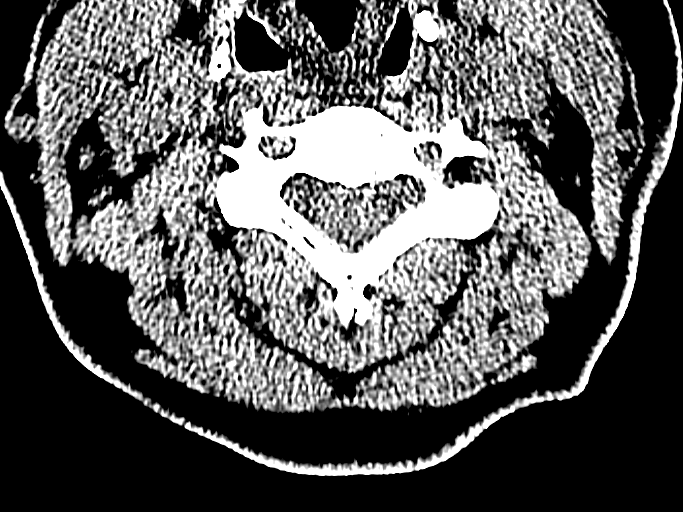
[im 48/77  brain]
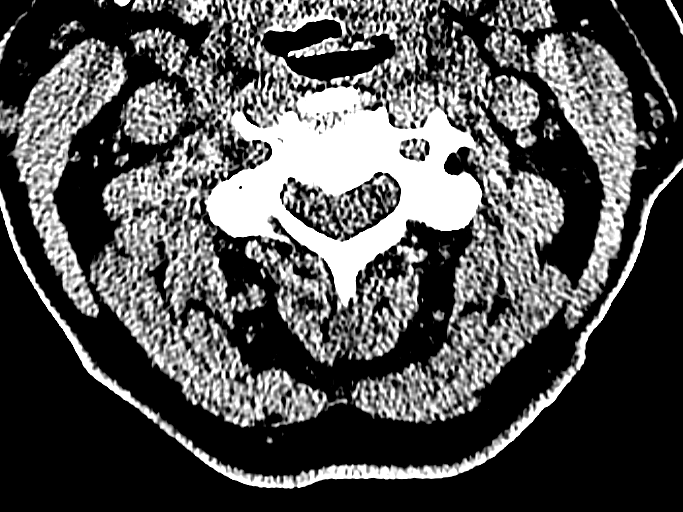
[im 48/77  bone]
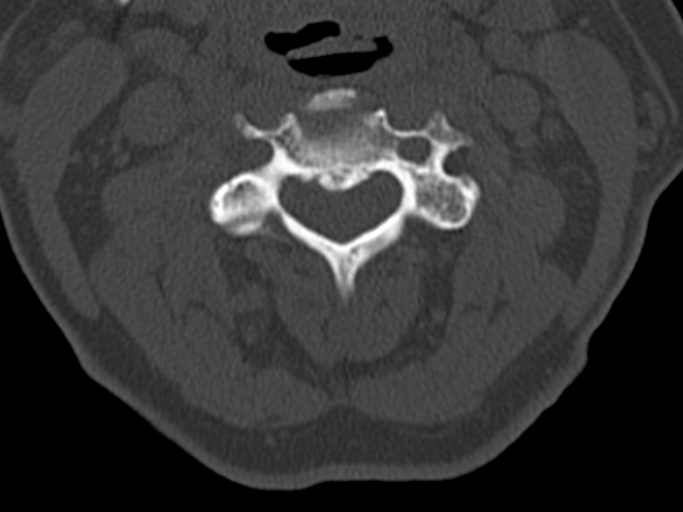
[im 58/77  brain]
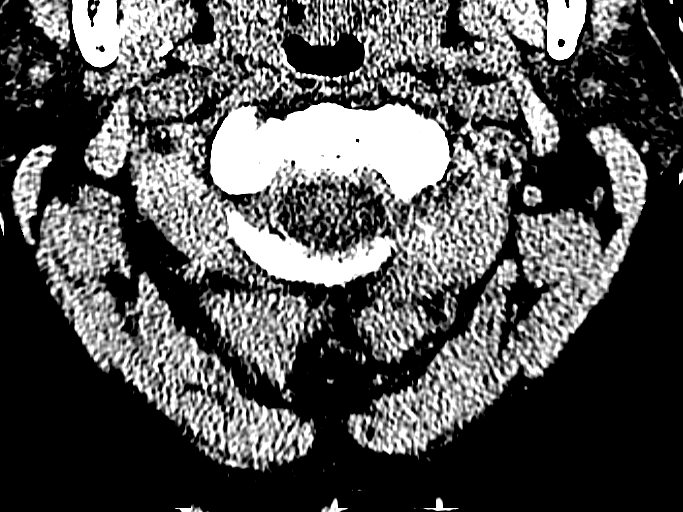
[im 67/77  brain]
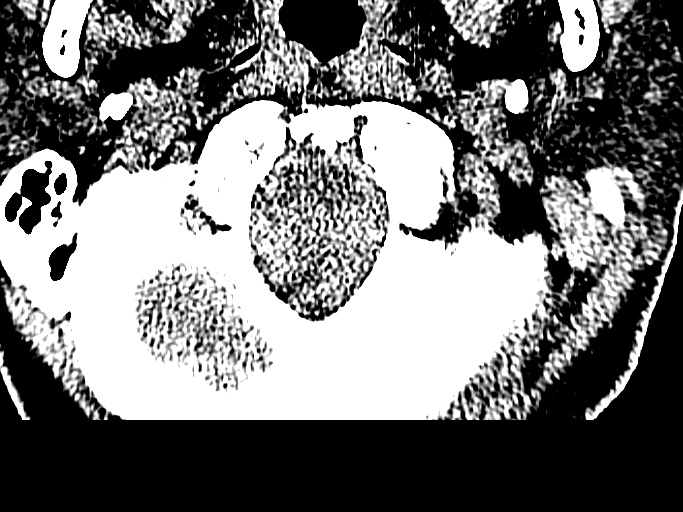

[14 of 30 positions shown; findings below may reference images not displayed]

FINDINGS: CT HEAD FINDINGS

No evidence of intracranial hemorrhage, brain edema, or other signs
of acute infarction. No evidence of intracranial mass lesion or mass
effect.

No abnormal extraaxial fluid collections identified. Ventricles are
normal in size. No skull abnormality identified.

CT CERVICAL SPINE FINDINGS

No evidence of acute fracture, subluxation, or prevertebral soft
tissue swelling.

Mild to moderate degenerative disc disease is seen at the C5-6 and
C6-7. Other intervertebral disc spaces are maintained. No evidence
of facet arthropathy or other significant bone abnormality. Mild
atlantoaxial degenerative changes are noted.
IMPRESSION: Negative noncontrast head CT.

No evidence of cervical spine fracture or subluxation. Mild C5-6 and
C6-7 degenerative disc disease.

## 2017-07-30 ENCOUNTER — Emergency Department
Admission: EM | Admit: 2017-07-30 | Discharge: 2017-07-30 | Disposition: A | Discharge status: Home / Self Care | Payer: Medicare Other | Attending: Emergency Medicine | Admitting: Emergency Medicine

## 2017-07-30 ENCOUNTER — Encounter: Payer: Self-pay | Admitting: Emergency Medicine

## 2017-07-30 ENCOUNTER — Other Ambulatory Visit: Payer: Self-pay

## 2017-07-30 DIAGNOSIS — F329 Major depressive disorder, single episode, unspecified: Secondary | ICD-10-CM | POA: Diagnosis not present

## 2017-07-30 DIAGNOSIS — R0600 Dyspnea, unspecified: Secondary | ICD-10-CM | POA: Diagnosis not present

## 2017-07-30 DIAGNOSIS — F419 Anxiety disorder, unspecified: Secondary | ICD-10-CM | POA: Diagnosis not present

## 2017-07-30 DIAGNOSIS — R0602 Shortness of breath: Secondary | ICD-10-CM | POA: Diagnosis present

## 2017-07-30 DIAGNOSIS — Z79899 Other long term (current) drug therapy: Secondary | ICD-10-CM | POA: Insufficient documentation

## 2017-07-30 DIAGNOSIS — Z7982 Long term (current) use of aspirin: Secondary | ICD-10-CM | POA: Diagnosis not present

## 2017-07-30 MED ORDER — DIAZEPAM 5 MG PO TABS
5.0000 mg | ORAL_TABLET | Freq: Once | ORAL | Status: AC
  Administered 2017-07-30: 5 mg via ORAL
  Filled 2017-07-30 (×2): qty 1

## 2017-07-30 MED ORDER — ALBUTEROL SULFATE (2.5 MG/3ML) 0.083% IN NEBU
2.5000 mg | INHALATION_SOLUTION | Freq: Once | RESPIRATORY_TRACT | Status: AC
  Administered 2017-07-30: 2.5 mg via RESPIRATORY_TRACT
  Filled 2017-07-30: qty 3

## 2017-07-30 MED ORDER — ALBUTEROL SULFATE HFA 108 (90 BASE) MCG/ACT IN AERS: 2.0000 | INHALATION_SPRAY | Freq: Four times a day (QID) | RESPIRATORY_TRACT | 1 refills | Status: AC | PRN

## 2017-07-30 NOTE — Discharge Instructions (Signed)
Return to the emergency department immediately for new or worsening of breath, tightness in her throat, severe anxiety, weakness, lightheadedness, chest pain, or any other new or worsening symptoms that concern you.

## 2017-07-30 NOTE — ED Provider Notes (Signed)
[facility] Emergency Department Provider Note ____________________________________________   First MD Initiated Contact with Patient 07/30/17 1104     (approximate)  I have reviewed the triage vital signs and the nursing notes.   HISTORY  Chief Complaint Shortness of Breath    HPI Lori Maddox is a 67 y.o. female with past medical history as noted below who presents with shortness of breath, acute onset after she vomited once while eating breakfast, and associated with a sensation of tightness in her throat.  Patient states that she subsequently became extremely anxious and the shortness of breath worsened.  She states that she could not find her inhaler that she normally uses when she breath, and this made her more anxious as well.  The patient states she was in her usual state of health until this morning.  She denies any chest pain, sore throat, or other vomiting besides the one episode this morning.  Past Medical History:  Diagnosis Date  . Atrophic vaginitis   . Back pain   . Depression   . Genital herpes   . Hiatal hernia   . Incomplete bladder emptying   . Meniere disease   . Microscopic hematuria   . Sinus headache   . TMJ syndrome   . Urethral caruncle   . Vertigo     Patient Active Problem List   Diagnosis Date Noted  . Chronic post-traumatic headache, not intractable 12/24/2015  . Confusion 12/24/2015  . Dizziness 12/24/2015  . Post concussion syndrome 12/24/2015  . Microscopic hematuria 01/12/2015  . Atrophic vaginitis 01/12/2015  . Urethral caruncle 01/12/2015  . Abnormal urinalysis 08/05/2014  . Abnormal CT scan, gastrointestinal tract 07/14/2014  . Back pain, acute 07/14/2014  . H/O hematuria 07/14/2014  . LLQ pain 07/14/2014  . LUQ abdominal pain 07/14/2014    Past Surgical History:  Procedure Laterality Date  . CESAREAN SECTION  1979/1982   2  . EYE SURGERY  2000  . Liposuction  2000  . SHOULDER ARTHROSCOPY Left  2002  . TUBAL LIGATION  1983    Prior to Admission medications   Medication Sig Start Date End Date Taking? Authorizing Provider  albuterol (PROAIR HFA) 108 (90 BASE) MCG/ACT inhaler Inhale into the lungs. 11/17/14 11/17/15  [provider]  aspirin 325 MG tablet Take 325 mg by mouth daily. Reported on 01/12/2016    [provider]  estradiol (ESTRACE) 0.1 MG/GM vaginal cream Place 1 Applicatorful vaginally at bedtime. Patient was given a sample of vaginal estrogen cream and instructed to apply 0.5mg  (pea-sized amount)  just inside the vaginal introitus with a finger-tip every night for two weeks and then Monday, Wednesday and Friday nights. 01/08/15   Michiel CowboyMcGowan, Shannon A, PA-C  gabapentin (NEURONTIN) 300 MG capsule Take 300 mg by mouth 3 (three) times daily.    [provider]  meclizine (ANTIVERT) 25 MG tablet Take by mouth. 12/11/15   [provider]  nortriptyline (PAMELOR) 10 MG capsule Reported on 01/12/2016 12/24/15   [provider]    Allergies Iodinated diagnostic agents; Shellfish allergy; Ibuprofen; and Strawberry extract  Family History  Problem Relation Age of Onset  . Diabetes Mother   . Hematuria Mother   . Uterine cancer Mother   . Bladder Cancer Mother   . Cancer Father        Pancreatic  . Cancer Sister   . Diabetes Brother   . Kidney disease Neg Hx     Social History Social History  Tobacco Use  . Smoking status: Never Smoker  . Smokeless tobacco: Never Used  Substance Use Topics  . Alcohol use: Yes    Alcohol/week: 0.0 oz    Comment: occasional  . Drug use: No    Review of Systems  Constitutional: No fever. Eyes: No redness. ENT: Positive for tightness in the throat. Cardiovascular: Denies chest pain. Respiratory: Positive for shortness of breath. Gastrointestinal: Positive for resolved vomiting.  Genitourinary: Negative for flank pain.  Musculoskeletal: Negative for back pain. Skin: Negative for  rash. Neurological: Negative for headache.   ____________________________________________   PHYSICAL EXAM:  VITAL SIGNS: ED Triage Vitals [07/30/17 1056]  Enc Vitals Group     BP (!) 143/83     Pulse Rate 97     Resp (!) 26     Temp 98.1 F (36.7 C)     Temp Source Oral     SpO2 100 %     Weight 157 lb (71.2 kg)     Height 5\' 3"  (1.6 m)     Head Circumference      Peak Flow      Pain Score      Pain Loc      Pain Edu?      Excl. in GC?     Constitutional: Alert and oriented.  Anxious and uncomfortable appearing. Eyes: Conjunctivae are normal.  Head: Atraumatic. Nose: No congestion/rhinnorhea. Mouth/Throat: Mucous membranes are moist.  Oropharynx clear with no visible swelling, pulled secretions, or exudates. Neck: Normal range of motion.  Cardiovascular: Normal rate, regular rhythm. Grossly normal heart sounds.  Good peripheral circulation. Respiratory: Slightly increased respiratory effort.  No retractions. Lungs CTAB. Gastrointestinal: No distention.  Musculoskeletal: No lower extremity edema.  Extremities warm and well perfused.  Neurologic:  Normal speech and language. No gross focal neurologic deficits are appreciated.  Skin:  Skin is warm and dry. No rash noted. Psychiatric: Mood and affect are normal. Speech and behavior are normal.  ____________________________________________   LABS (all labs ordered are listed, but only abnormal results are displayed)  Labs Reviewed - No data to display ____________________________________________  EKG   ____________________________________________  RADIOLOGY    ____________________________________________   PROCEDURES  Procedure(s) performed: No    Critical Care performed: No ____________________________________________   INITIAL IMPRESSION / ASSESSMENT AND PLAN / ED COURSE  Pertinent labs & imaging results that were available during my care of the patient were reviewed by me and considered in my  medical decision making (see chart for details).  67 year old female with past medical history as noted above presents with acute onset of shortness of breath.  Patient states that initially while eating breakfast she became nauseous and had one episode of vomiting, and after that she felt like her throat was closing and became anxious.  She did not have her inhaler with her and states that the shortness of breath seemed to get worse at this time.  Past medical records reviewed in epic and are noncontributory.  On exam, the patient is extremely anxious and tearful appearing, and appears to be short of breath but when reassured and instructed to slow down her breathing she is able to breathe at a normal rate.  Her lungs are clear, her O2 saturation is 100%, and her other vital signs are normal.  Her oropharynx is also clear.  I suspect the patient is having a combination of possibly some acute bronchospasm with component of anxiety.  Given the acute nature of the symptoms, this is not  consistent with pneumonia, viral or bacterial bronchitis, or cardiac cause.  There is no clinical evidence for PE given the vomiting and throat tightness and the lack of chest pain or hypoxia.  Plan: P.o. Valium, albuterol nebulizer, and reassess.  Patient has any persistent symptoms we may do additional workup, however if her symptoms completely resolve after this she will likely be discharged home.  ----------------------------------------- 12:45 PM on 07/30/2017 -----------------------------------------  Patient reports that her symptoms are entirely resolved.  Her O2 sat is 100% on room air.  Her lungs are clear to auscultation, and her oropharynx is clear as well.  Given that the symptoms have resolved and her vitals are normal chest x-ray or additional workup at this time.  The patient feels comfortable to go home.  Overall clinical picture consistent with initial assessment; combination of possible acute bronchitis  with anxiety.  I will prescribe a new inhaler for the patient.  Return precautions given, the patient and her husband expressed understanding.  ____________________________________________   FINAL CLINICAL IMPRESSION(S) / ED DIAGNOSES  Final diagnoses:  Dyspnea, unspecified type  Anxiety      NEW MEDICATIONS STARTED DURING THIS VISIT:  New Prescriptions   No medications on file     Note:  This document was prepared using Dragon voice recognition software and may include unintentional dictation errors.     Dionne Bucy, MD 07/30/17 1247

## 2017-07-30 NOTE — ED Triage Notes (Signed)
States woke up this morning feeling fine, while eating breakfast vomiting x 1 and felt SOB and like throat was tightening.  Patient has bronchitis, and couldn't find inhaler.  Patient arrives anxious and tearful, hyperventilating.  Lungs with good air entry and movement auscultated.

## 2017-07-30 NOTE — ED Notes (Signed)
Patient sitting on the edge of the bed upon my arrival in the room.  Patient presents incredibly anxious and hyperventilating.  Informed her that this RN had medication to administer that would help with the anxiety.  Patient voluntarily took medication at this time and then  Began to throw it up on the floor stating that she couldn't tolerate it.  Patient given albuterol treatment and verbalized that she would try again once the neb treatment was completed.  MD updated.

## 2017-07-30 NOTE — ED Notes (Signed)
Patient feeling much better at this time. Patient's husband at bedside with her.  Patient no longer has tachypnea and states that she feels much better and is ready to be discharged.  MD notified.

## 2018-02-12 ENCOUNTER — Encounter: Payer: Medicare Other | Attending: Family Medicine | Admitting: Dietician

## 2018-02-12 ENCOUNTER — Encounter: Payer: Self-pay | Admitting: Dietician

## 2018-02-12 VITALS — BP 108/72 | Ht 63.0 in | Wt 160.7 lb

## 2018-02-12 DIAGNOSIS — E119 Type 2 diabetes mellitus without complications: Secondary | ICD-10-CM | POA: Diagnosis present

## 2018-02-12 DIAGNOSIS — Z713 Dietary counseling and surveillance: Secondary | ICD-10-CM | POA: Insufficient documentation

## 2018-02-12 NOTE — Progress Notes (Signed)
Diabetes Self-Management Education  Visit Type: First/Initial  Appt. Start Time: 1445 Appt. End Time: 1600  02/12/2018  Lori Maddox, identified by name and date of birth, is a 67 y.o. female with a diagnosis of Diabetes: Type 2.   ASSESSMENT  Blood pressure 108/72, height 5\' 3"  (1.6 m), weight 160 lb 11.2 oz (72.9 kg). Body mass index is 28.47 kg/m.  Diabetes Self-Management Education - 02/12/18 1617      Visit Information   Visit Type  First/Initial      Initial Visit   Diabetes Type  Type 2      Health Coping   How would you rate your overall health?  Excellent      Psychosocial Assessment   Patient Belief/Attitude about Diabetes  Motivated to manage diabetes    Self-care barriers  None    Self-management support  Doctor's office;Family    Other persons present  Patient    Patient Concerns  Weight Control;Glycemic Control;Healthy Lifestyle prevent complications    Special Needs  None    Preferred Learning Style  Hands on;Auditory    Learning Readiness  Ready    What is the last grade level you completed in school?  doctorate degree      Pre-Education Assessment   Patient understands the diabetes disease and treatment process.  Needs Instruction    Patient understands incorporating nutritional management into lifestyle.  Needs Instruction    Patient undertands incorporating physical activity into lifestyle.  Needs Instruction    Patient understands using medications safely.  Needs Instruction    Patient understands monitoring blood glucose, interpreting and using results  Needs Instruction    Patient understands prevention, detection, and treatment of acute complications.  Needs Instruction    Patient understands prevention, detection, and treatment of chronic complications.  Needs Instruction    Patient understands how to develop strategies to address psychosocial issues.  Needs Instruction    Patient understands how to develop strategies to promote  health/change behavior.  Needs Instruction      Complications   Last HgB A1C per patient/outside source  6.6 %    How often do you check your blood sugar?  0 times/day (not testing)    Have you had a dilated eye exam in the past 12 months?  No 1.5 yrs ago-appt 04-16-18    Have you had a dental exam in the past 12 months?  Yes 11-2017    Are you checking your feet?  Yes    How many days per week are you checking your feet?  7      Dietary Intake   Breakfast  eats breakfast 7a-9a=2 boiled eggs or yogurt or special K with milk    Snack (morning)  none    Lunch  eats lunch 11:30a-1:30p=yogurt, meat sandwich or salad    Snack (afternoon)  eats almonds or trail mix at 2:30P-3P    Dinner  eats supper 5:30-7p=meat and vegetables     Snack (evening)  none      Exercise   Exercise Type  Light (walking / raking leaves);Moderate (swimming / aerobic walking) cardio/aerobics    How many days per week to you exercise?  120    How many minutes per day do you exercise?  3.5    Total minutes per week of exercise  420      Patient Education   Previous Diabetes Education  No    Disease state   Definition of diabetes, type 1 and 2,  and the diagnosis of diabetes;Factors that contribute to the development of diabetes    Nutrition management   Role of diet in the treatment of diabetes and the relationship between the three main macronutrients and blood glucose level;Food label reading, portion sizes and measuring food.;Carbohydrate counting    Physical activity and exercise   Role of exercise on diabetes management, blood pressure control and cardiac health.;Helped patient identify appropriate exercises in relation to his/her diabetes, diabetes complications and other health issue.    Monitoring  Taught/evaluated SMBG meter.;Purpose and frequency of SMBG.;Taught/discussed recording of test results and interpretation of SMBG.;Identified appropriate SMBG and/or A1C goals.;Yearly dilated eye exam    Chronic  complications  Relationship between chronic complications and blood glucose control;Lipid levels, blood glucose control and heart disease;Retinopathy and reason for yearly dilated eye exams;Reviewed with patient heart disease, higher risk of, and prevention;Nephropathy, what it is, prevention of, the use of ACE, ARB's and early detection of through urine microalbumia.;Dental care    Personal strategies to promote health  Lifestyle issues that need to be addressed for better diabetes care;Helped patient develop diabetes management plan for (enter comment)      Outcomes   Expected Outcomes  Demonstrated interest in learning. Expect positive outcomes       Individualized Plan for Diabetes Self-Management Training:   Learning Objective:  Patient will have a greater understanding of diabetes self-management. Patient education plan is to attend individual and/or group sessions per assessed needs and concerns.   Plan:   Patient Instructions   Check blood sugars 2 x day before breakfast and 2 hrs after supper every other day and record  Bring blood sugar records to the next appointment/class  Call your doctor for a prescription for:  1. Meter strips (type)  One Touch Verio test strips checking  2 times per day  2. Lancets (type)  One Touch Delica lancets   checking 2   times per day  Exercise:  Continue cardio/aerobics 2 hr 3-4x/wk  Eat 3 meals day,   1  snack a day at bedtime  Space meals 4-6 hours apart  Eat 2-3 carbohydrate servings/meal + protein  Eat 1 carbohydrate serving/snack + protein  Avoid sugar sweetened drinks (soda, tea, coffee, sports drinks, juices)  Limit intake of sweets/desserts and fried foods  Get a Sharps container  Return for appointment/classes on: 02-22-18   Expected Outcomes:  Demonstrated interest in learning. Expect positive outcomes  Education material provided: General meal planning guidelines, One Touch Verio meter   If problems or questions,  patient to contact team via: [phone]  Future DSME appointment:  02-22-18

## 2018-02-12 NOTE — Patient Instructions (Signed)
  Check blood sugars 2 x day before breakfast and 2 hrs after supper every other day and record  Bring blood sugar records to the next appointment/class  Call your doctor for a prescription for:  1. Meter strips (type)  One Touch Verio test strips checking  2 times per day  2. Lancets (type)  One Touch Delica lancets   checking 2   times per day  Exercise:  Continue cardio/aerobics 2 hr 3-4x/wk  Eat 3 meals day,   1  snack a day at bedtime  Space meals 4-6 hours apart  Eat 2-3 carbohydrate servings/meal + protein  Eat 1 carbohydrate serving/snack + protein  Avoid sugar sweetened drinks (soda, tea, coffee, sports drinks, juices)  Limit intake of sweets/desserts and fried foods  Get a Sharps container  Return for appointment/classes on: 02-22-18

## 2018-02-22 ENCOUNTER — Encounter: Payer: Self-pay | Admitting: Dietician

## 2018-02-22 ENCOUNTER — Encounter: Payer: Medicare Other | Attending: Family Medicine | Admitting: Dietician

## 2018-02-22 VITALS — Wt 162.3 lb

## 2018-02-22 DIAGNOSIS — E119 Type 2 diabetes mellitus without complications: Secondary | ICD-10-CM | POA: Diagnosis present

## 2018-02-22 DIAGNOSIS — Z713 Dietary counseling and surveillance: Secondary | ICD-10-CM | POA: Diagnosis not present

## 2018-02-22 NOTE — Progress Notes (Signed)

## 2018-03-01 ENCOUNTER — Encounter: Payer: Medicare Other | Admitting: *Deleted

## 2018-03-01 VITALS — Wt 161.4 lb

## 2018-03-01 DIAGNOSIS — E119 Type 2 diabetes mellitus without complications: Secondary | ICD-10-CM

## 2018-03-01 NOTE — Progress Notes (Signed)

## 2018-04-05 ENCOUNTER — Encounter: Payer: Self-pay | Admitting: Dietician

## 2018-04-05 ENCOUNTER — Encounter: Payer: Medicare Other | Attending: Family Medicine | Admitting: Dietician

## 2018-04-05 VITALS — Ht 63.0 in | Wt 154.4 lb

## 2018-04-05 DIAGNOSIS — Z713 Dietary counseling and surveillance: Secondary | ICD-10-CM | POA: Diagnosis not present

## 2018-04-05 DIAGNOSIS — E119 Type 2 diabetes mellitus without complications: Secondary | ICD-10-CM | POA: Insufficient documentation

## 2018-04-05 NOTE — Progress Notes (Signed)

## 2018-04-16 ENCOUNTER — Encounter: Payer: Self-pay | Admitting: Dietician

## 2018-08-16 ENCOUNTER — Emergency Department
Admission: EM | Admit: 2018-08-16 | Discharge: 2018-08-16 | Disposition: A | Discharge status: Home / Self Care | Payer: Medicare Other | Attending: Emergency Medicine | Admitting: Emergency Medicine

## 2018-08-16 ENCOUNTER — Encounter: Payer: Self-pay | Admitting: Emergency Medicine

## 2018-08-16 DIAGNOSIS — Z79899 Other long term (current) drug therapy: Secondary | ICD-10-CM | POA: Diagnosis not present

## 2018-08-16 DIAGNOSIS — J101 Influenza due to other identified influenza virus with other respiratory manifestations: Secondary | ICD-10-CM | POA: Insufficient documentation

## 2018-08-16 DIAGNOSIS — J111 Influenza due to unidentified influenza virus with other respiratory manifestations: Secondary | ICD-10-CM

## 2018-08-16 DIAGNOSIS — Z7982 Long term (current) use of aspirin: Secondary | ICD-10-CM | POA: Diagnosis not present

## 2018-08-16 DIAGNOSIS — R05 Cough: Secondary | ICD-10-CM

## 2018-08-16 DIAGNOSIS — R059 Cough, unspecified: Secondary | ICD-10-CM

## 2018-08-16 DIAGNOSIS — R51 Headache: Secondary | ICD-10-CM | POA: Diagnosis present

## 2018-08-16 LAB — INFLUENZA PANEL BY PCR (TYPE A & B)
Influenza A By PCR: POSITIVE — AB
Influenza B By PCR: NEGATIVE

## 2018-08-16 MED ORDER — BENZONATATE 100 MG PO CAPS: ORAL_CAPSULE | ORAL | 0 refills | Status: DC

## 2018-08-16 MED ORDER — OSELTAMIVIR PHOSPHATE 75 MG PO CAPS: 75.0000 mg | ORAL_CAPSULE | Freq: Two times a day (BID) | ORAL | 0 refills | Status: AC

## 2018-08-16 MED ORDER — ONDANSETRON 4 MG PO TBDP: 4.0000 mg | ORAL_TABLET | Freq: Three times a day (TID) | ORAL | 0 refills | Status: DC | PRN

## 2018-08-16 MED ORDER — FLUTICASONE PROPIONATE 50 MCG/ACT NA SUSP: 2.0000 | Freq: Every day | NASAL | 0 refills | Status: DC

## 2018-08-16 NOTE — ED Provider Notes (Signed)
[facility] Provider Note ____________________________________________  Time seen: 1925  I have reviewed the triage vital signs and the nursing notes.  HISTORY  Chief Complaint  Influenza  HPI Lori Maddox is a 68 y.o. female presents herself to the ED for flulike symptoms.  Patient describes she was with her 38-year-old grandson 2 days ago when he was apparently diagnosed with flu.  She rode back home from [place] on the [place] train, and began to experience headaches, fatigue, malaise, and fevers yesterday.  She reports a T-max this morning of 102 F.  She did receive the seasonal flu vaccine for the season.  She presents now for further evaluation and management of a presumed flu.  She reports nausea without vomiting, body aches, headache, and sinus pressure.  Past Medical History:  Diagnosis Date  . Atrophic vaginitis   . Back pain   . Depression   . Genital herpes   . Hiatal hernia   . Incomplete bladder emptying   . Meniere disease   . Microscopic hematuria   . Sinus headache   . TMJ syndrome   . Urethral caruncle   . Vertigo     Patient Active Problem List   Diagnosis Date Noted  . Chronic post-traumatic headache, not intractable 12/24/2015  . Confusion 12/24/2015  . Dizziness 12/24/2015  . Post concussion syndrome 12/24/2015  . Microscopic hematuria 01/12/2015  . Atrophic vaginitis 01/12/2015  . Urethral caruncle 01/12/2015  . Abnormal urinalysis 08/05/2014  . Abnormal CT scan, gastrointestinal tract 07/14/2014  . Back pain, acute 07/14/2014  . H/O hematuria 07/14/2014  . LLQ pain 07/14/2014  . LUQ abdominal pain 07/14/2014    Past Surgical History:  Procedure Laterality Date  . CESAREAN SECTION  1979/1982   2  . EYE SURGERY  2000  . Liposuction  2000  . SHOULDER ARTHROSCOPY Left 2002  . TUBAL LIGATION  1983    Prior to Admission medications   Medication Sig Start Date End Date Taking? Authorizing Provider   albuterol (PROAIR HFA) 108 (90 Base) MCG/ACT inhaler Inhale 2 puffs into the lungs every 6 (six) hours as needed for wheezing or shortness of breath. 07/30/17 07/30/18  Dionne Bucy, MD  aspirin 325 MG tablet Take 325 mg by mouth daily. Reported on 01/12/2016    [provider]  benzonatate (TESSALON PERLES) 100 MG capsule Take 1-2 tabs TID prn cough 08/16/18   Shalae Belmonte, Charlesetta Ivory, PA-C  diclofenac sodium (VOLTAREN) 1 % GEL Apply 1 application topically 4 (four) times daily as needed. 01/22/18 01/22/19  [provider]  estradiol (ESTRACE) 0.1 MG/GM vaginal cream Place 1 Applicatorful vaginally at bedtime. Patient was given a sample of vaginal estrogen cream and instructed to apply 0.5mg  (pea-sized amount)  just inside the vaginal introitus with a finger-tip every night for two weeks and then Monday, Wednesday and Friday nights. Patient not taking: Reported on 02/12/2018 01/08/15   Michiel Cowboy A, PA-C  fluticasone (FLONASE) 50 MCG/ACT nasal spray Place 2 sprays into both nostrils daily. 08/16/18   Olene Godfrey, Charlesetta Ivory, PA-C  gabapentin (NEURONTIN) 300 MG capsule Take 300 mg by mouth 3 (three) times daily.    [provider]  meclizine (ANTIVERT) 25 MG tablet Take by mouth. 12/11/15   [provider]  naproxen sodium (ALEVE) 220 MG tablet Take 220 mg by mouth as needed.    [provider]  nortriptyline (PAMELOR) 10 MG capsule Reported on 01/12/2016 12/24/15   [provider]  ondansetron (ZOFRAN ODT) 4 MG disintegrating tablet Take 1 tablet (4 mg total) by mouth every 8 (eight) hours as needed. 08/16/18   Majid Mccravy, Charlesetta Ivory, PA-C  oseltamivir (TAMIFLU) 75 MG capsule Take 1 capsule (75 mg total) by mouth 2 (two) times daily for 5 days. 08/16/18 08/21/18  Jaima Janney, Charlesetta Ivory, PA-C    Allergies Iodinated diagnostic agents; Shellfish allergy; Ibuprofen; and Strawberry extract  Family History  Problem Relation Age of Onset  . Diabetes  Mother   . Hematuria Mother   . Uterine cancer Mother   . Bladder Cancer Mother   . Cancer Father        Pancreatic  . Cancer Sister   . Diabetes Brother   . Kidney disease Neg Hx     Social History Social History   Tobacco Use  . Smoking status: Never Smoker  . Smokeless tobacco: Never Used  Substance Use Topics  . Alcohol use: Yes    Alcohol/week: 0.0 - 1.0 standard drinks    Comment: occasional  . Drug use: No    Review of Systems  Constitutional: Positive for fever and chills. Eyes: Negative for visual changes. ENT: Positive for sore throat. Cardiovascular: Negative for chest pain. Respiratory: Negative for shortness of breath.  Reports nonproductive cough. Gastrointestinal: Negative for abdominal pain, vomiting and diarrhea. Genitourinary: Negative for dysuria. Musculoskeletal: Negative for back pain. Skin: Negative for rash. Neurological: Negative for focal weakness or numbness.  Reports headache ____________________________________________  PHYSICAL EXAM:  VITAL SIGNS: ED Triage Vitals  Enc Vitals Group     BP 08/16/18 1856 113/79     Pulse Rate 08/16/18 1856 72     Resp 08/16/18 1856 20     Temp 08/16/18 1856 98.4 F (36.9 C)     Temp Source 08/16/18 1856 Oral     SpO2 08/16/18 1856 96 %     Weight 08/16/18 1901 140 lb (63.5 kg)     Height 08/16/18 1901 5\' 3"  (1.6 m)     Head Circumference --      Peak Flow --      Pain Score 08/16/18 1901 9     Pain Loc --      Pain Edu? --      Excl. in GC? --     Constitutional: Alert and oriented. Well appearing and in no distress. Head: Normocephalic and atraumatic. Eyes: Conjunctivae are normal. PERRL. Normal extraocular movements Ears: Canals clear. TMs intact bilaterally. Nose: No congestion/rhinorrhea/epistaxis. Mouth/Throat: Mucous membranes are moist.  Uvula is midline and tonsils are flat.  No oropharyngeal lesions are appreciated. Neck: Supple. No  thyromegaly. Hematological/Lymphatic/Immunological: No cervical lymphadenopathy. Cardiovascular: Normal rate, regular rhythm. Normal distal pulses. Respiratory: Normal respiratory effort. No wheezes/rales/rhonchi. Musculoskeletal: Nontender with normal range of motion in all extremities.  Neurologic:  Normal gait without ataxia. Normal speech and language. No gross focal neurologic deficits are appreciated. Skin:  Skin is warm, dry and intact. No rash noted. ____________________________________________   LABS (pertinent positives/negatives) Labs Reviewed  INFLUENZA PANEL BY PCR (TYPE A & B) - Abnormal; Notable for the following components:      Result Value   Influenza A By PCR POSITIVE (*)    All other components within normal limits  ____________________________________________  PROCEDURES  Procedures ___________________________________________  INITIAL IMPRESSION / ASSESSMENT AND PLAN / ED COURSE  She with ED evaluation of flulike symptoms after close contact with a flu positive family member.  Patient's PCR test is positive for influenza A at this  time.  She is within the treatment window for Tamiflu, and is inclined to start the treatment regimen.  She be discharged with prescriptions for that in addition to Flonase, [employer] Pet[employer]on Perles, and Zofran.  Patient is encouraged to follow-up with her primary provider or return to the ED as needed. ____________________________________________  FINAL CLINICAL IMPRESSION(S) / ED DIAGNOSES  Final diagnoses:  Influenza  Cough      Karmen StabsMenshew, Charlesetta IvoryJenise V Bacon, PA-C 08/16/18 [place] [place]2003    Stafford, Phillip, MD 08/16/18 [phone]

## 2018-08-16 NOTE — ED Triage Notes (Signed)
Pt stating "I have a cold." Pt stating HA, sore throat, chills, and cough. Pt stating sx started yesterday. Pt stating fevers also. 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

## 2018-08-16 NOTE — Discharge Instructions (Signed)
You are being treated for flu. Your exposure and symptoms are sufficient for treatment without testing. Take the prescription meds as directed. Follow-up with your provider for ongoing symptoms.

## 2018-12-11 ENCOUNTER — Telehealth: Payer: Self-pay | Admitting: *Deleted

## 2018-12-11 ENCOUNTER — Encounter: Payer: Self-pay | Admitting: Dietician

## 2018-12-11 NOTE — Telephone Encounter (Signed)
Received voice mail from patient with questions regarding blood sugars. Returned call and pt was alarmed that her blood sugar was 212 this morning - fasting. Most fasting blood sugars have been 90-110 and post meals 110's. She had reading of 190 last evening after eating ribs, baked beans and potato salad for supper. Explained that readings my be higher post meal and fasting when having high fat meal at supper. She took her reading again and it was now 27. She didn't wash her hands this morning prior to checking her blood sugar. Also discussed other things that can raise blood sugars - stress, lack of sleep, sickness. Instructed her not to be alarmed for 1 or 2 higher readings. Discussed recommended ranges for fasting and post meal blood sugars. She doesn't take any diabetes medications. Encouraged her to continue to make healthy meal choices and praised her for doing well with diabetes management.  Instructed her to call back for any further questions.

## 2021-11-08 ENCOUNTER — Ambulatory Visit: Payer: Medicare Other | Admitting: Dermatology

## 2022-01-27 ENCOUNTER — Ambulatory Visit: Payer: Medicare Other | Admitting: Dermatology

## 2022-08-09 ENCOUNTER — Ambulatory Visit: Payer: Medicare PPO | Admitting: Dermatology

## 2022-08-09 DIAGNOSIS — L820 Inflamed seborrheic keratosis: Secondary | ICD-10-CM

## 2022-08-09 DIAGNOSIS — L821 Other seborrheic keratosis: Secondary | ICD-10-CM

## 2022-08-09 NOTE — Progress Notes (Signed)
   Follow-Up Visit   Subjective  Lori Maddox is a 72 y.o. female who presents for the following: New Patient (Initial Visit) (Patient would like to discuss some spots at face and neck that bother her and discuss treatment. ). Itchy.  The patient has spots, moles and lesions to be evaluated, some may be new or changing and the patient has concerns that these could be cancer.   The following portions of the chart were reviewed this encounter and updated as appropriate:      Review of Systems: No other skin or systemic complaints except as noted in HPI or Assessment and Plan.   Objective  Well appearing patient in no apparent distress; mood and affect are within normal limits.  A focused examination was performed including right neck, left forehead and left cheek. Relevant physical exam findings are noted in the Assessment and Plan.  left malar cheek x 1, right neck x 1, left forehead x 1 (3) Erythematous stuck-on, waxy papule        Assessment & Plan  Inflamed seborrheic keratosis (3) left malar cheek x 1, right neck x 1, left forehead x 1  Symptomatic, irritating, patient would like treated.  Prior to procedure, discussed risks of small wound, skin dyspigmentation, or rare scar following ED. Recommend Vaseline ointment to treated areas while healing. Recommend sunscreen when outdoors to aas.  May need additional treatment  Destruction of lesion - left malar cheek x 1, right neck x 1, left forehead x 1  Destruction method: electrodesiccation and curettage   Destruction method comment:  Electrodesiccation only, not curretage Informed consent: discussed and consent obtained   Timeout:  patient name, date of birth, surgical site, and procedure verified Anesthesia: the lesion was anesthetized in a standard fashion   Anesthetic:  1% lidocaine w/ epinephrine 1-100,000 local infiltration Hemostasis achieved with:  electrodesiccation Post-procedure details: wound care  instructions given   Additional details:  Mupirocin ointment and Bandaid applied   Seborrheic Keratoses - Stuck-on, waxy, tan-brown papules and/or plaques  - Benign-appearing - Discussed benign etiology and prognosis. - Observe - Call for any changes   Return for 2 month isk follow up.  I, Ruthell Rummage, CMA, am acting as scribe for Brendolyn Patty, MD.  Documentation: I have reviewed the above documentation for accuracy and completeness, and I agree with the above.  Brendolyn Patty MD

## 2022-08-09 NOTE — Patient Instructions (Addendum)
Seborrheic Keratosis  What causes seborrheic keratoses? Seborrheic keratoses are harmless, common skin growths that first appear during adult life.  As time goes by, more growths appear.  Some people may develop a large number of them.  Seborrheic keratoses appear on both covered and uncovered body parts.  They are not caused by sunlight.  The tendency to develop seborrheic keratoses can be inherited.  They vary in color from skin-colored to gray, brown, or even black.  They can be either smooth or have a rough, warty surface.   Seborrheic keratoses are superficial and look as if they were stuck on the skin.  Under the microscope this type of keratosis looks like layers upon layers of skin.  That is why at times the top layer may seem to fall off, but the rest of the growth remains and re-grows.    Treatment Seborrheic keratoses do not need to be treated, but can easily be removed in the office.  Seborrheic keratoses often cause symptoms when they rub on clothing or jewelry.  Lesions can be in the way of shaving.  If they become inflamed, they can cause itching, soreness, or burning.  Removal of a seborrheic keratosis can be accomplished by freezing, burning, or surgery. If any spot bleeds, scabs, or grows rapidly, please return to have it checked, as these can be an indication of a skin cancer.   Due to recent changes in healthcare laws, you may see results of your pathology and/or laboratory studies on MyChart before the doctors have had a chance to review them. We understand that in some cases there may be results that are confusing or concerning to you. Please understand that not all results are received at the same time and often the doctors may need to interpret multiple results in order to provide you with the best plan of care or course of treatment. Therefore, we ask that you please give us 2 business days to thoroughly review all your results before contacting the office for clarification.  Should we see a critical lab result, you will be contacted sooner.   If You Need Anything After Your Visit  If you have any questions or concerns for your doctor, please call our main line at [phone] and press option 4 to reach your doctor's medical assistant. If no one answers, please leave a voicemail as directed and we will return your call as soon as possible. Messages left after 4 pm will be answered the following business day.   You may also send us a message via MyChart. We typically respond to MyChart messages within 1-2 business days.  For prescription refills, please ask your pharmacy to contact our office. Our fax number is [phone].  If you have an urgent issue when the clinic is closed that cannot wait until the next business day, you can page your doctor at the number below.    Please note that while we do our best to be available for urgent issues outside of office hours, we are not available 24/7.   If you have an urgent issue and are unable to reach us, you may choose to seek medical care at your doctor's office, retail clinic, urgent care center, or emergency room.  If you have a medical emergency, please immediately call 911 or go to the emergency department.  Pager Numbers  - Dr. Kowalski: [phone]  - Dr. Moye: [phone]  - Dr. Stewart: [phone]  In the event of inclement weather, please call our main line   at [phone] for an update on the status of any delays or closures.  Dermatology Medication Tips: Please keep the boxes that topical medications come in in order to help keep track of the instructions about where and how to use these. Pharmacies typically print the medication instructions only on the boxes and not directly on the medication tubes.   If your medication is too expensive, please contact our office at [phone] option 4 or send us a message through MyChart.   We are unable to tell what your co-pay for medications will be  in advance as this is different depending on your insurance coverage. However, we may be able to find a substitute medication at lower cost or fill out paperwork to get insurance to cover a needed medication.   If a prior authorization is required to get your medication covered by your insurance company, please allow us 1-2 business days to complete this process.  Drug prices often vary depending on where the prescription is filled and some pharmacies may offer cheaper prices.  The website www.goodrx.com contains coupons for medications through different pharmacies. The prices here do not account for what the cost may be with help from insurance (it may be cheaper with your insurance), but the website can give you the price if you did not use any insurance.  - You can print the associated coupon and take it with your prescription to the pharmacy.  - You may also stop by our office during regular business hours and pick up a GoodRx coupon card.  - If you need your prescription sent electronically to a different pharmacy, notify our office through [facility] MyChart or by phone at [phone] option 4.     Si Usted Necesita Algo Despus de Su Visita  Tambin puede enviarnos un mensaje a travs de MyChart. Por lo general respondemos a los mensajes de MyChart en el transcurso de 1 a 2 das hbiles.  Para renovar recetas, por favor pida a su farmacia que se ponga en contacto con nuestra oficina. Nuestro nmero de fax es el [phone].  Si tiene un asunto urgente cuando la clnica est cerrada y que no puede esperar hasta el siguiente da hbil, puede llamar/localizar a su doctor(a) al nmero que aparece a continuacin.   Por favor, tenga en cuenta que aunque hacemos todo lo posible para estar disponibles para asuntos urgentes fuera del horario de oficina, no estamos disponibles las 24 horas del da, los 7 das de la semana.   Si tiene un problema urgente y no puede comunicarse con nosotros,  puede optar por buscar atencin mdica  en el consultorio de su doctor(a), en una clnica privada, en un centro de atencin urgente o en una sala de emergencias.  Si tiene una emergencia mdica, por favor llame inmediatamente al 911 o vaya a la sala de emergencias.  Nmeros de bper  - Dr. Kowalski: [phone]  - Dra. Moye: [phone]  - Dra. Stewart: [phone]  En caso de inclemencias del tiempo, por favor llame a nuestra lnea principal al [phone] para una actualizacin sobre el estado de cualquier retraso o cierre.  Consejos para la medicacin en dermatologa: Por favor, guarde las cajas en las que vienen los medicamentos de uso tpico para ayudarle a seguir las instrucciones sobre dnde y cmo usarlos. Las farmacias generalmente imprimen las instrucciones del medicamento slo en las cajas y no directamente en los tubos del medicamento.   Si su medicamento es muy caro, por favor, pngase en contacto   con nuestra oficina llamando al [phone] y presione la opcin 4 o envenos un mensaje a travs de MyChart.   No podemos decirle cul ser su copago por los medicamentos por adelantado ya que esto es diferente dependiendo de la cobertura de su seguro. Sin embargo, es posible que podamos encontrar un medicamento sustituto a menor costo o llenar un formulario para que el seguro cubra el medicamento que se considera necesario.   Si se requiere una autorizacin previa para que su compaa de seguros cubra su medicamento, por favor permtanos de 1 a 2 das hbiles para completar este proceso.  Los precios de los medicamentos varan con frecuencia dependiendo del lugar de dnde se surte la receta y alguna farmacias pueden ofrecer precios ms baratos.  El sitio web www.goodrx.com tiene cupones para medicamentos de diferentes farmacias. Los precios aqu no tienen en cuenta lo que podra costar con la ayuda del seguro (puede ser ms barato con su seguro), pero el sitio web puede darle el  precio si no utiliz ningn seguro.  - Puede imprimir el cupn correspondiente y llevarlo con su receta a la farmacia.  - Tambin puede pasar por nuestra oficina durante el horario de atencin regular y recoger una tarjeta de cupones de GoodRx.  - Si necesita que su receta se enve electrnicamente a una farmacia diferente, informe a nuestra oficina a travs de MyChart de [facility] o por telfono llamando al [phone] y presione la opcin 4.  

## 2022-09-20 ENCOUNTER — Ambulatory Visit: Payer: TRICARE For Life (TFL) | Admitting: Dermatology

## 2022-09-21 ENCOUNTER — Ambulatory Visit: Payer: Medicare PPO | Admitting: Dermatology

## 2022-09-21 ENCOUNTER — Encounter: Payer: Self-pay | Admitting: Dermatology

## 2022-09-21 VITALS — BP 113/70 | HR 77

## 2022-09-21 DIAGNOSIS — L820 Inflamed seborrheic keratosis: Secondary | ICD-10-CM

## 2022-09-21 DIAGNOSIS — L720 Epidermal cyst: Secondary | ICD-10-CM | POA: Diagnosis not present

## 2022-09-21 DIAGNOSIS — L821 Other seborrheic keratosis: Secondary | ICD-10-CM

## 2022-09-21 DIAGNOSIS — L814 Other melanin hyperpigmentation: Secondary | ICD-10-CM | POA: Diagnosis not present

## 2022-09-21 NOTE — Progress Notes (Signed)
   Follow-Up Visit   Subjective  Lori Maddox is a 72 y.o. female who presents for the following: Follow-up (Recheck ISK's treated 08/09/2022. Tx with ED. Left cheek, left forehead, right neck).  Spot on forehead and neck cleared up, still has spot on left cheek.  She has some additional spots on left neck that she picks at and are irritated.  The patient has spots, moles and lesions to be evaluated, some may be new or changing and the patient has concerns that these could be cancer.   The following portions of the chart were reviewed this encounter and updated as appropriate:      Review of Systems: No other skin or systemic complaints except as noted in HPI or Assessment and Plan.   Objective  Well appearing patient in no apparent distress; mood and affect are within normal limits.  A focused examination was performed including head, including the scalp, face, neck, nose, ears, eyelids, and lips. Relevant physical exam findings are noted in the Assessment and Plan.  left lower anterior neck x2 (2) Smooth white papule(s).   Left Malar Cheek Residual erythematous keratotic or waxy stuck-on papule   Assessment & Plan    Milia (2) left lower anterior neck x2  Symptomatic, irritating, patient would like treated.   Acne/Milia surgery - left lower anterior neck x2 Procedure risks and benefits were discussed with the patient and verbal consent was obtained. Following prep of the skin on the neck with an alcohol swab and local anesthesia injection, extraction of milia was performed with a comedone extractor following superficial incision made over their surfaces with a #11 surgical blade. Capillary hemostasis was achieved with 20% aluminum chloride solution. Vaseline ointment was applied to each site. The patient tolerated the procedure well.  Inflamed seborrheic keratosis Left Malar Cheek  Symptomatic, irritating, patient would like treated.  2nd treatment-  electrodessication  Destruction of lesion - Left Malar Cheek  Destruction method: electrodesiccation and curettage   Destruction method comment:  Electrodesiccation only, not curretage Timeout:  patient name, date of birth, surgical site, and procedure verified Anesthesia: the lesion was anesthetized in a standard fashion   Anesthetic:  1% lidocaine w/ epinephrine 1-100,000 buffered w/ 8.4% NaHCO3 Hemostasis achieved with:  electrodesiccation Outcome: patient tolerated procedure well with no complications   Post-procedure details: wound care instructions given   Additional details:  Mupirocin ointment applied   Seborrheic Keratoses - Stuck-on, waxy, tan-brown papules face - Benign-appearing - Discussed benign etiology and prognosis. - Observe - Call for any changes  Lentigines - Scattered tan macules face - Due to sun exposure - Benign-appearing, observe - Recommend daily broad spectrum sunscreen SPF 30+ to sun-exposed areas, reapply every 2 hours as needed. - Call for any changes    Return if symptoms worsen or fail to improve.  I, Emelia Salisbury, CMA, am acting as scribe for Brendolyn Patty, MD.  Documentation: I have reviewed the above documentation for accuracy and completeness, and I agree with the above.  Brendolyn Patty MD

## 2022-09-21 NOTE — Patient Instructions (Addendum)
Wash daily with soap and water. Apply Vaseline Jelly 2-3 times daily.   Recommend daily broad spectrum sunscreen SPF 30+ to sun-exposed areas, reapply every 2 hours as needed. Call for new or changing lesions.  Staying in the shade or wearing long sleeves, sun glasses (UVA+UVB protection) and wide brim hats (4-inch brim around the entire circumference of the hat) are also recommended for sun protection.    Due to recent changes in healthcare laws, you may see results of your pathology and/or laboratory studies on MyChart before the doctors have had a chance to review them. We understand that in some cases there may be results that are confusing or concerning to you. Please understand that not all results are received at the same time and often the doctors may need to interpret multiple results in order to provide you with the best plan of care or course of treatment. Therefore, we ask that you please give [place] 2 business days to thoroughly review all your results before contacting the office for clarification. Should we see a critical lab result, you will be contacted sooner.   If You Need Anything After Your Visit  If you have any questions or concerns for your doctor, please call our main line at [phone] and press option 4 to reach your doctor's medical assistant. If no one answers, please leave a voicemail as directed and we will return your call as soon as possible. Messages left after 4 pm will be answered the following business day.   You may also send [place] a message via [place]. We typically respond to MyChart messages within 1-2 business days.  For prescription refills, please ask your pharmacy to contact our office. Our fax number is [phone].  If you have an urgent issue when the clinic is closed that cannot wait until the next business day, you can page your doctor at the number below.    Please note that while we do our best to be available for urgent issues outside of office  hours, we are not available 24/7.   If you have an urgent issue and are unable to reach [place], you may choose to seek medical care at your doctor's office, retail clinic, urgent care center, or emergency room.  If you have a medical emergency, please immediately call 911 or go to the emergency department.  Pager Numbers  - Dr. Nehemiah Massed: [phone]  - Dr. Laurence Ferrari: [phone]  - Dr. Nicole Kindred: [phone]  In the event of inclement weather, please call our main line at [phone] for an update on the status of any delays or closures.  Dermatology Medication Tips: Please keep the boxes that topical medications come in in order to help keep track of the instructions about where and how to use these. Pharmacies typically print the medication instructions only on the boxes and not directly on the medication tubes.   If your medication is too expensive, please contact our office at [phone] option 4 or send [place] a message through [place].   We are unable to tell what your co-pay for medications will be in advance as this is different depending on your insurance coverage. However, we may be able to find a substitute medication at lower cost or fill out paperwork to get insurance to cover a needed medication.   If a prior authorization is required to get your medication covered by your insurance company, please allow [place] 1-2 business days to complete this process.  Drug prices often vary depending on where the  prescription is filled and some pharmacies may offer cheaper prices.  The website www.goodrx.com contains coupons for medications through different pharmacies. The prices here do not account for what the cost may be with help from insurance (it may be cheaper with your insurance), but the website can give you the price if you did not use any insurance.  - You can print the associated coupon and take it with your prescription to the pharmacy.  - You may also stop by our office during regular  business hours and pick up a GoodRx coupon card.  - If you need your prescription sent electronically to a different pharmacy, notify our office through [facility] or by phone at [phone] option 4.     Si Usted Necesita Algo Despus de Su Visita  Tambin puede enviarnos un mensaje a travs de Pharmacist, community. Por lo general respondemos a los mensajes de MyChart en el transcurso de 1 a 2 das hbiles.  Para renovar recetas, por favor pida a su farmacia que se ponga en contacto con nuestra oficina. Harland Dingwall de fax es [place] [phone].  Si tiene un asunto urgente cuando la clnica est cerrada y que no puede esperar hasta el siguiente da hbil, puede llamar/localizar a su doctor(a) al nmero que aparece a continuacin.   Por favor, tenga en cuenta que aunque hacemos todo lo posible para estar disponibles para asuntos urgentes fuera del horario de [place], no estamos disponibles las 24 horas del da, los 7 das de la [place].   Si tiene un problema urgente y no puede comunicarse con nosotros, puede optar por buscar atencin mdica  en el consultorio de su doctor(a), en una clnica privada, en un centro de atencin urgente o en una sala de emergencias.  Si tiene Engineering geologist, por favor llame inmediatamente al 911 o vaya a la sala de emergencias.  Nmeros de bper  - Dr. Nehemiah Massed: [phone]  - Dra. Moye: [phone]  - Dra. Nicole Kindred: [phone]  En caso de inclemencias del [place], por favor llame a Johnsie Kindred principal al [phone] para una actualizacin sobre el [place] de cualquier retraso o cierre.  Consejos para la medicacin en dermatologa: Por favor, guarde las cajas en las que vienen los medicamentos de uso tpico para ayudarle a seguir las instrucciones sobre dnde y cmo usarlos. Las farmacias generalmente imprimen las instrucciones del medicamento slo en las cajas y no directamente en los tubos del [place].   Si su medicamento es muy caro, por  favor, pngase en contacto con Zigmund Daniel llamando al [phone] y presione la opcin 4 o envenos un mensaje a travs de Pharmacist, community.   No podemos decirle cul ser su copago por los medicamentos por adelantado ya que esto es diferente dependiendo de la cobertura de su seguro. Sin embargo, es posible que podamos encontrar un medicamento sustituto a Electrical engineer un formulario para que el seguro cubra el medicamento que se considera necesario.   Si se requiere una autorizacin previa para que su compaa de seguros [place] su medicamento, por favor permtanos de 1 a 2 das hbiles para completar este proceso.  Los precios de los medicamentos varan con frecuencia dependiendo del Environmental consultant de dnde se surte la receta y alguna farmacias pueden ofrecer precios ms baratos.  El sitio web www.goodrx.com tiene cupones para medicamentos de Airline pilot. Los precios aqu no tienen en cuenta lo que podra costar con la ayuda del seguro (puede ser ms barato con su seguro), pero el sitio web  puede darle el precio si no utiliz [employer].  - Puede imprimir el cupn correspondiente y llevarlo con su receta a la farmacia.  - Tambin puede pasar por nuestra oficina durante el horario de atencin regular y Charity fundraiser una tarjeta de cupones de GoodRx.  - Si necesita que su receta se enve electrnicamente a una farmacia diferente, informe a nuestra oficina a travs de MyChart de [place] o por telfono llamando al [phone] y presione la opcin 4.

## 2023-09-04 ENCOUNTER — Emergency Department: Payer: Medicare PPO

## 2023-09-04 ENCOUNTER — Other Ambulatory Visit: Payer: Self-pay

## 2023-09-04 ENCOUNTER — Emergency Department
Admission: EM | Admit: 2023-09-04 | Discharge: 2023-09-04 | Disposition: A | Discharge status: Home / Self Care | Payer: Medicare PPO | Attending: Student in an Organized Health Care Education/Training Program | Admitting: Student in an Organized Health Care Education/Training Program

## 2023-09-04 DIAGNOSIS — M25512 Pain in left shoulder: Secondary | ICD-10-CM | POA: Insufficient documentation

## 2023-09-04 DIAGNOSIS — Y92410 Unspecified street and highway as the place of occurrence of the external cause: Secondary | ICD-10-CM | POA: Insufficient documentation

## 2023-09-04 DIAGNOSIS — S0990XA Unspecified injury of head, initial encounter: Secondary | ICD-10-CM | POA: Diagnosis present

## 2023-09-04 DIAGNOSIS — R109 Unspecified abdominal pain: Secondary | ICD-10-CM | POA: Diagnosis not present

## 2023-09-04 MED ORDER — OXYCODONE-ACETAMINOPHEN 5-325 MG PO TABS
1.0000 | ORAL_TABLET | Freq: Once | ORAL | Status: AC
  Administered 2023-09-04: 1 via ORAL
  Filled 2023-09-04: qty 1
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
## 2023-09-04 NOTE — ED Provider Notes (Signed)
CT scan is reassuring, appropriate for discharge at this time   Jene Every, MD 09/04/23 [phone]

## 2023-09-04 NOTE — ED Notes (Signed)
Patient refused lab work. Stated she wanted to talk to provider about the necissity of having it done first. Provider informed. Sitting in bed talking with family, no signs of distress.

## 2023-11-03 ENCOUNTER — Other Ambulatory Visit: Payer: Self-pay | Admitting: Student

## 2023-11-03 DIAGNOSIS — G44309 Post-traumatic headache, unspecified, not intractable: Secondary | ICD-10-CM

## 2023-11-03 DIAGNOSIS — R519 Headache, unspecified: Secondary | ICD-10-CM

## 2023-11-07 ENCOUNTER — Encounter: Payer: Self-pay | Admitting: Student

## 2023-11-17 ENCOUNTER — Ambulatory Visit
Admission: RE | Admit: 2023-11-17 | Discharge: 2023-11-17 | Disposition: A | Discharge status: Home / Self Care | Source: Ambulatory Visit | Attending: Student | Admitting: Student

## 2023-11-17 DIAGNOSIS — G44309 Post-traumatic headache, unspecified, not intractable: Secondary | ICD-10-CM

## 2023-11-17 DIAGNOSIS — R519 Headache, unspecified: Secondary | ICD-10-CM

## 2023-12-06 ENCOUNTER — Other Ambulatory Visit

## 2023-12-06 ENCOUNTER — Ambulatory Visit: Payer: Self-pay

## 2023-12-06 DIAGNOSIS — K6289 Other specified diseases of anus and rectum: Secondary | ICD-10-CM | POA: Diagnosis not present

## 2023-12-06 DIAGNOSIS — Z1211 Encounter for screening for malignant neoplasm of colon: Secondary | ICD-10-CM | POA: Diagnosis not present

## 2024-08-13 ENCOUNTER — Ambulatory Visit
Admission: RE | Admit: 2024-08-13 | Discharge: 2024-08-13 | Disposition: A | Discharge status: Home / Self Care | Source: Ambulatory Visit | Attending: Orthopedic Surgery | Admitting: Orthopedic Surgery

## 2024-08-13 ENCOUNTER — Other Ambulatory Visit: Payer: Self-pay | Admitting: Orthopedic Surgery

## 2024-08-13 DIAGNOSIS — S83231A Complex tear of medial meniscus, current injury, right knee, initial encounter: Secondary | ICD-10-CM | POA: Diagnosis present
